# Patient Record
Sex: Male | Born: 1935 | Race: White | Hispanic: No | Marital: Married | Smoking: Never smoker
Health system: Southern US, Community
[De-identification: ages and names within clinical notes are randomized; demographics above are authoritative.]

## PROBLEM LIST (undated history)

## (undated) DIAGNOSIS — R011 Cardiac murmur, unspecified: Secondary | ICD-10-CM

## (undated) DIAGNOSIS — M1990 Unspecified osteoarthritis, unspecified site: Secondary | ICD-10-CM

## (undated) DIAGNOSIS — Z87442 Personal history of urinary calculi: Secondary | ICD-10-CM

## (undated) DIAGNOSIS — C61 Malignant neoplasm of prostate: Secondary | ICD-10-CM

## (undated) DIAGNOSIS — I10 Essential (primary) hypertension: Secondary | ICD-10-CM

## (undated) DIAGNOSIS — R339 Retention of urine, unspecified: Secondary | ICD-10-CM

## (undated) DIAGNOSIS — M109 Gout, unspecified: Secondary | ICD-10-CM

## (undated) HISTORY — DX: Gout, unspecified: M10.9

## (undated) HISTORY — DX: Unspecified osteoarthritis, unspecified site: M19.90

## (undated) HISTORY — DX: Essential (primary) hypertension: I10

## (undated) HISTORY — PX: TOTAL KNEE ARTHROPLASTY: SHX125

## (undated) HISTORY — PX: URINARY SPHINCTER IMPLANT: SHX2624

## (undated) HISTORY — DX: Malignant neoplasm of prostate: C61

---

## 1997-12-03 HISTORY — PX: PROSTATECTOMY: SHX69

## 2012-07-21 DIAGNOSIS — I10 Essential (primary) hypertension: Secondary | ICD-10-CM | POA: Insufficient documentation

## 2012-07-21 DIAGNOSIS — E7800 Pure hypercholesterolemia, unspecified: Secondary | ICD-10-CM | POA: Insufficient documentation

## 2012-07-21 DIAGNOSIS — I2510 Atherosclerotic heart disease of native coronary artery without angina pectoris: Secondary | ICD-10-CM | POA: Insufficient documentation

## 2012-09-07 DIAGNOSIS — Z8579 Personal history of other malignant neoplasms of lymphoid, hematopoietic and related tissues: Secondary | ICD-10-CM | POA: Insufficient documentation

## 2014-06-07 DIAGNOSIS — Z8679 Personal history of other diseases of the circulatory system: Secondary | ICD-10-CM | POA: Insufficient documentation

## 2014-06-07 DIAGNOSIS — F0390 Unspecified dementia without behavioral disturbance: Secondary | ICD-10-CM | POA: Insufficient documentation

## 2014-08-03 DIAGNOSIS — M25562 Pain in left knee: Secondary | ICD-10-CM

## 2014-08-03 DIAGNOSIS — M25561 Pain in right knee: Secondary | ICD-10-CM | POA: Insufficient documentation

## 2014-09-24 DIAGNOSIS — M5416 Radiculopathy, lumbar region: Secondary | ICD-10-CM | POA: Insufficient documentation

## 2016-05-20 DIAGNOSIS — N184 Chronic kidney disease, stage 4 (severe): Secondary | ICD-10-CM | POA: Insufficient documentation

## 2016-05-20 DIAGNOSIS — E785 Hyperlipidemia, unspecified: Secondary | ICD-10-CM | POA: Insufficient documentation

## 2016-07-03 ENCOUNTER — Encounter: Payer: Self-pay | Admitting: Podiatry

## 2016-07-03 ENCOUNTER — Ambulatory Visit (INDEPENDENT_AMBULATORY_CARE_PROVIDER_SITE_OTHER): Payer: Medicare Other | Admitting: Podiatry

## 2016-07-03 VITALS — BP 134/70 | HR 63 | Resp 16 | Ht 69.0 in | Wt 197.0 lb

## 2016-07-03 DIAGNOSIS — L84 Corns and callosities: Secondary | ICD-10-CM | POA: Diagnosis not present

## 2016-07-03 DIAGNOSIS — B351 Tinea unguium: Secondary | ICD-10-CM

## 2016-07-03 DIAGNOSIS — M79674 Pain in right toe(s): Secondary | ICD-10-CM

## 2016-07-03 DIAGNOSIS — M79675 Pain in left toe(s): Secondary | ICD-10-CM

## 2016-07-03 NOTE — Progress Notes (Signed)
   Subjective:    Patient ID: Alexander Hogan, male    DOB: 1922-02-20, 82 y.o.   MRN: [id number]  HPI Chief Complaint  Patient presents with  . Nail Problem    Right foot; great toe; nail discoloration & thickened nail  . Callouses    Bilateral; plantar forefoot-below 15th toe   82 year old male presents the office today for concerns of thick, painful, elongated toenails that she cannot trim herself particular right big toe. Denies any redness or drainage from the toenails. Also states that she has calluses to both feet just below the fifth toe. No drainage or swelling or redness or erythema. No other complaints at this time.   Review of Systems  HENT: Positive for hearing loss.   Musculoskeletal: Positive for gait problem.  All other systems reviewed and are negative.      Objective:   Physical Exam General: AAO x3, NAD  Dermatological: Nails are hypertrophic, dystrophic, brittle, discolored, elongated 10. No surrounding redness or drainage. Tenderness nails 1-5 bilateral laterally. The keratotic lesions bilateral subdural metatarsal side. No underlying ulceration, drainage or other signs of infection. No other open lesions or pre-ulcerative lesions identified at this time.  Vascular: Dorsalis Pedis artery and Posterior Tibial artery pedal pulses are 2/4 bilateral with immedate capillary fill time.  There is no pain with calf compression, swelling, warmth, erythema.   Neruologic: Grossly intact via light touch bilateral. Vibratory intact via tuning fork bilateral. Protective threshold with Semmes Wienstein monofilament intact to all pedal sites bilateral.   Musculoskeletal: No gross boney pedal deformities bilateral. No pain, crepitus, or limitation noted with foot and ankle range of motion bilateral. Muscular strength 5/5 in all groups tested bilateral.  Gait: Unassisted, Nonantalgic.     Assessment & Plan:  82 year old male with symptomatic onychomycosis, hyperkeratotic  lesions -Treatment options discussed including all alternatives, risks, and complications -Etiology of symptoms were discussed -Nails debrided 10 without complications or bleeding. -Hyperkeratotic lesions debrided 2 without complete complications or bleeding. -Daily foot inspection -Follow-up in 3 months or sooner if any problems arise. In the meantime, encouraged to call the office with any questions, concerns, change in symptoms.   Celesta Gentile, DPM

## 2017-05-14 ENCOUNTER — Other Ambulatory Visit: Payer: Self-pay

## 2017-05-14 DIAGNOSIS — M5432 Sciatica, left side: Secondary | ICD-10-CM

## 2017-05-21 ENCOUNTER — Ambulatory Visit
Admission: RE | Admit: 2017-05-21 | Discharge: 2017-05-21 | Disposition: A | Discharge status: Home / Self Care | Payer: Medicare Other | Source: Ambulatory Visit | Attending: Family Medicine | Admitting: Family Medicine

## 2017-05-21 DIAGNOSIS — M5136 Other intervertebral disc degeneration, lumbar region: Secondary | ICD-10-CM | POA: Insufficient documentation

## 2017-05-21 DIAGNOSIS — M5432 Sciatica, left side: Secondary | ICD-10-CM | POA: Diagnosis present

## 2017-05-21 DIAGNOSIS — M48061 Spinal stenosis, lumbar region without neurogenic claudication: Secondary | ICD-10-CM | POA: Insufficient documentation

## 2017-05-21 DIAGNOSIS — M4856XA Collapsed vertebra, not elsewhere classified, lumbar region, initial encounter for fracture: Secondary | ICD-10-CM | POA: Diagnosis not present

## 2017-10-08 ENCOUNTER — Encounter: Payer: Self-pay | Admitting: Urology

## 2017-10-08 ENCOUNTER — Ambulatory Visit (INDEPENDENT_AMBULATORY_CARE_PROVIDER_SITE_OTHER): Payer: Medicare Other | Admitting: Urology

## 2017-10-08 VITALS — BP 149/80 | HR 81 | Ht 69.0 in | Wt 200.0 lb

## 2017-10-08 DIAGNOSIS — R32 Unspecified urinary incontinence: Secondary | ICD-10-CM

## 2017-10-08 DIAGNOSIS — Z8546 Personal history of malignant neoplasm of prostate: Secondary | ICD-10-CM | POA: Diagnosis not present

## 2017-10-08 DIAGNOSIS — N393 Stress incontinence (female) (male): Secondary | ICD-10-CM

## 2017-10-08 LAB — URINALYSIS, COMPLETE
Bilirubin, UA: NEGATIVE
Glucose, UA: NEGATIVE
KETONES UA: NEGATIVE
LEUKOCYTES UA: NEGATIVE
NITRITE UA: NEGATIVE
Protein, UA: NEGATIVE
RBC UA: NEGATIVE
SPEC GRAV UA: 1.01 (ref 1.005–1.030)
UUROB: 0.2 mg/dL (ref 0.2–1.0)
pH, UA: 5.5 (ref 5.0–7.5)

## 2017-10-08 LAB — BLADDER SCAN AMB NON-IMAGING: Scan Result: 18

## 2017-10-08 NOTE — Progress Notes (Signed)
10/08/2017 4:46 PM   Alexander Hogan [birth year] [id number]  Referring provider: Dion Body, MD [place] [facility] [place], [place] [address]  Chief Complaint  Patient presents with  . Urinary Incontinence    HPI: 83 year old male with a history of prostate cancer status post radical prostatectomy in 1999.  He had post prostatectomy incontinence and had placement of an artificial urinary sphincter.  He had a sphincter replaced around 2012 and subsequently required a revision.  He presently states he is doing well.  He has mild dysuria at times.  His wife says he strains to urinate however he denies.  He has no specific complaints today and wanted to establish care in the area.  His PSA remained undetectable and has not been checked since 2012.   PMH: Past Medical History:  Diagnosis Date  . Arthritis   . Asthma   . Gout   . High blood pressure   . Prostate cancer [facility])     Surgical History: Past Surgical History:  Procedure Laterality Date  . PROSTATECTOMY  1999  . TOTAL KNEE ARTHROPLASTY  2005-2008   x2    Home Medications:  Allergies as of 10/08/2017      Reactions   Hydrocodone Other (See Comments)   confusion   Penicillins Rash      Medication List        Accurate as of 10/08/17  4:46 PM. Always use your most recent med list.          atorvastatin 80 MG tablet Commonly known as:  LIPITOR   donepezil 10 MG tablet Commonly known as:  ARICEPT   furosemide 80 MG tablet Commonly known as:  LASIX Take by mouth.   hyoscyamine 0.125 MG SL tablet Commonly known as:  LEVSIN SL dissolve 1 to 2 tablets UNDER THE TONGUE every 4 hours if needed . DO NOT TAKE MORE THAN 12 TABLETS IN 24 HOURS   memantine 10 MG tablet Commonly known as:  NAMENDA   memantine 10 MG tablet Commonly known as:  NAMENDA TAKE 1 TABLET TWICE A DAY   metoprolol succinate 25 MG 24 hr tablet Commonly known as:  TOPROL-XL   URIBEL 118 MG Caps Take by  mouth.       Allergies:  Allergies  Allergen Reactions  . Hydrocodone Other (See Comments)    confusion  . Penicillins Rash    Family History: History reviewed. No pertinent family history.  Social History:  reports that  has never smoked. He does not have any smokeless tobacco history on file. His alcohol and drug histories are not on file.  ROS: UROLOGY Frequent Urination?: Yes Hard to postpone urination?: No Burning/pain with urination?: Yes Get up at night to urinate?: Yes Leakage of urine?: Yes Urine stream starts and stops?: No Trouble starting stream?: Yes Do you have to strain to urinate?: Yes Blood in urine?: No Urinary tract infection?: Yes Sexually transmitted disease?: No Injury to kidneys or bladder?: No Painful intercourse?: No Weak stream?: No Erection problems?: No Penile pain?: No  Gastrointestinal Nausea?: No Vomiting?: No Indigestion/heartburn?: No Diarrhea?: Yes Constipation?: Yes  Constitutional Fever: No Night sweats?: No Weight loss?: No Fatigue?: No  Skin Skin rash/lesions?: Yes Itching?: No  Eyes Blurred vision?: No Double vision?: No  Ears/Nose/Throat Sore throat?: No Sinus problems?: Yes  Hematologic/Lymphatic Swollen glands?: No Easy bruising?: Yes  Cardiovascular Leg swelling?: Yes Chest pain?: No  Respiratory Cough?: No Shortness of breath?: No  Endocrine Excessive thirst?:  No  Musculoskeletal Back pain?: Yes Joint pain?: Yes  Neurological Headaches?: No Dizziness?: No  Psychologic Depression?: No Anxiety?: No  Physical Exam: BP (!) 149/80 (BP Location: Right Arm, Patient Position: Sitting, Cuff Size: Large)   Pulse 81   Ht 5\' 9"  (1.753 m)   Wt 200 lb (90.7 kg)   BMI 29.53 kg/m   Constitutional:  Alert and oriented, No acute distress. HEENT: [place] AT, moist mucus membranes.  Trachea midline, no masses. Cardiovascular: No clubbing, cyanosis, or edema. Respiratory: Normal respiratory effort, no  increased work of breathing. GI: Abdomen is soft, nontender, nondistended, no abdominal masses GU: No CVA tenderness.  Penis uncircumcised.  Testes descended and atrophic.  Pump easily palpable right hemiscrotum Skin: No rashes, bruises or suspicious lesions. Lymph: No cervical or inguinal adenopathy. Neurologic: Grossly intact, no focal deficits, moving all 4 extremities. Psychiatric: Normal mood and affect.  Laboratory Data:  Urinalysis Lab Results  Component Value Date   SPECGRAV 1.010 10/08/2017   PHUR 5.5 10/08/2017   COLORU Green (A) 10/08/2017   APPEARANCEUR Cloudy (A) 10/08/2017   LEUKOCYTESUR Negative 10/08/2017   PROTEINUR Negative 10/08/2017   GLUCOSEU Negative 10/08/2017   KETONESU Negative 10/08/2017   RBCU Negative 10/08/2017   BILIRUBINUR Negative 10/08/2017   UUROB 0.2 10/08/2017   NITRITE Negative 10/08/2017     Assessment & Plan:    1.  Post prostatectomy incontinence Doing well with artificial urinary sphincter.  He was initially unable to obtain a urine and his estimated bladder volume by bladder scan was only 18 mL.  He was subsequently able to void and urinalysis showed no abnormalities on microscopy.  He will follow-up annually or as needed.  - Urinalysis, Complete - Bladder Scan (Post Void Residual) in office   Return in about 1 year (around 10/08/2018) for Recheck.  Abbie Sons, [place] [address], [place], [place] [address] [phone]

## 2017-12-10 ENCOUNTER — Telehealth: Payer: Self-pay | Admitting: Urology

## 2017-12-10 NOTE — Telephone Encounter (Signed)
Pt states he needs some Cipro.  He saw [facility] 1 time and this is not for a refill, but he says sometimes doctors give him Cipro.  Please call pt [phone]

## 2017-12-10 NOTE — Telephone Encounter (Signed)
I would not recommend taking Cipro unless there is evidence of a UTI

## 2017-12-10 NOTE — Telephone Encounter (Signed)
Pt states that previous urologist would give him cipro as needed to go along with his uribel and therefore he was wanting more. Please advise.

## 2017-12-17 NOTE — Telephone Encounter (Signed)
Spoke with pt in reference to not taking cipro unless UTI is present. Pt became frustrated and stated he has an appt in Feb to discuss further with Dr. Bernardo Heater.

## 2018-06-09 ENCOUNTER — Other Ambulatory Visit: Payer: Self-pay | Admitting: Family Medicine

## 2018-06-09 ENCOUNTER — Ambulatory Visit
Admission: RE | Admit: 2018-06-09 | Discharge: 2018-06-09 | Disposition: A | Discharge status: Home / Self Care | Payer: Medicare Other | Source: Ambulatory Visit | Attending: Family Medicine | Admitting: Family Medicine

## 2018-06-09 DIAGNOSIS — R600 Localized edema: Secondary | ICD-10-CM | POA: Diagnosis not present

## 2018-06-09 DIAGNOSIS — M7989 Other specified soft tissue disorders: Secondary | ICD-10-CM

## 2018-08-26 ENCOUNTER — Other Ambulatory Visit: Payer: Self-pay

## 2018-08-26 ENCOUNTER — Inpatient Hospital Stay: Payer: Medicare Other | Attending: Oncology | Admitting: Oncology

## 2018-08-26 ENCOUNTER — Encounter: Payer: Self-pay | Admitting: Oncology

## 2018-08-26 VITALS — BP 107/68 | HR 70 | Temp 98.1°F | Resp 18 | Ht 69.0 in | Wt 198.1 lb

## 2018-08-26 DIAGNOSIS — Z8249 Family history of ischemic heart disease and other diseases of the circulatory system: Secondary | ICD-10-CM | POA: Insufficient documentation

## 2018-08-26 DIAGNOSIS — C438 Malignant melanoma of overlapping sites of skin: Secondary | ICD-10-CM | POA: Diagnosis present

## 2018-08-26 DIAGNOSIS — Z809 Family history of malignant neoplasm, unspecified: Secondary | ICD-10-CM

## 2018-08-26 DIAGNOSIS — Z8052 Family history of malignant neoplasm of bladder: Secondary | ICD-10-CM | POA: Diagnosis not present

## 2018-08-26 DIAGNOSIS — Z803 Family history of malignant neoplasm of breast: Secondary | ICD-10-CM | POA: Insufficient documentation

## 2018-08-26 DIAGNOSIS — Z79899 Other long term (current) drug therapy: Secondary | ICD-10-CM | POA: Diagnosis not present

## 2018-08-26 DIAGNOSIS — Z7189 Other specified counseling: Secondary | ICD-10-CM | POA: Insufficient documentation

## 2018-08-26 DIAGNOSIS — Z8546 Personal history of malignant neoplasm of prostate: Secondary | ICD-10-CM | POA: Diagnosis not present

## 2018-08-26 DIAGNOSIS — Z85820 Personal history of malignant melanoma of skin: Secondary | ICD-10-CM | POA: Diagnosis not present

## 2018-08-26 DIAGNOSIS — C439 Malignant melanoma of skin, unspecified: Secondary | ICD-10-CM | POA: Insufficient documentation

## 2018-08-26 NOTE — Progress Notes (Signed)
Hematology/Oncology Consult note [facility] Telephone:([phone] [phone]   Patient Care Team: Dion Body, MD as PCP - General (Family Medicine)  REFERRING PROVIDER: [facility] CHIEF COMPLAINTS/REASON FOR VISIT:  Evaluation of melanoma  HISTORY OF PRESENTING ILLNESS:  FREDI GEILER is a  84 y.o.  male with PMH listed below who was referred to me for evaluation of melanoma.  Patient recently saw dermatologist [facility] for evaluation of right lower extremity swelling and dark spots on the leg.  Patient reports history of melanoma of right lower extremity and s/p resection in May 2017. From May 2017 to April 2019, he has done well and per dermatology note, there was no evidence of recurrence and no lymphadenopathy.  03/19/2018, he developed an ulcer of this leg and was biopsied and pathology was benign.  04/17/2018 He had a follow up for persistent right medial pretibial ulcer and stasis changes, culture was positive for Staph Aureus. He was treated with Mupirocin and cephalexin. At the same time, 3 small blue macules were noted. Patient was scheduled to follow up in 6 weeks for biopsy as dermatology wants to wait until ulcer healed. Patient cancelled his appointment in July 2019 and no showed to another rescheduled appointment. He was recently seen in September 2019.   Right proximal lateral tibial lesion punch biopsy showed malignant melanoma, favor metastatic focus.   The breslow's thickness is 1.66m.   History of basal cell carcinoma s/p excision and Moh's surgery.  History of melanoma in situ of left cheek, s/p topical treatment.  04/03/2016 Right mid lateral pretibial melanoma, tumor thickness 0.73 anatomic level IV, s/p WLE, negative margin.   Review of Systems  Constitutional: Positive for malaise/fatigue. Negative for chills, fever and weight loss.  HENT: Negative for nosebleeds and sore throat.   Eyes: Negative for double vision,  photophobia and redness.  Respiratory: Negative for cough, shortness of breath and wheezing.   Cardiovascular: Positive for leg swelling. Negative for chest pain, palpitations and orthopnea.  Gastrointestinal: Negative for abdominal pain, blood in stool, nausea and vomiting.  Genitourinary: Negative for dysuria.  Musculoskeletal: Negative for back pain, myalgias and neck pain.  Skin: Positive for rash. Negative for itching.  Neurological: Negative for dizziness, tingling and tremors.  Endo/Heme/Allergies: Negative for environmental allergies. Does not bruise/bleed easily.  Psychiatric/Behavioral: Negative for depression.    MEDICAL HISTORY:  Past Medical History:  Diagnosis Date  . Arthritis   . Gout   . High blood pressure   . Prostate cancer ([facility]     SURGICAL HISTORY: Past Surgical History:  Procedure Laterality Date  . PROSTATECTOMY  1999  . TOTAL KNEE ARTHROPLASTY  2005-2008   x2    SOCIAL HISTORY: Social History   Socioeconomic History  . Marital status: Married    Spouse name: Not on file  . Number of children: Not on file  . Years of education: Not on file  . Highest education level: Not on file  Occupational History  . Not on file  Social Needs  . Financial resource strain: Not on file  . Food insecurity:    Worry: Not on file    Inability: Not on file  . Transportation needs:    Medical: Not on file    Non-medical: Not on file  Tobacco Use  . Smoking status: Never Smoker  . Smokeless tobacco: Never Used  Substance and Sexual Activity  . Alcohol use: Never    Frequency: Never  . Drug use: Never  . Sexual  activity: Not Currently  Lifestyle  . Physical activity:    Days per week: Not on file    Minutes per session: Not on file  . Stress: Not on file  Relationships  . Social connections:    Talks on phone: Not on file    Gets together: Not on file    Attends religious service: Not on file    Active member of club or organization: Not on file     Attends meetings of clubs or organizations: Not on file    Relationship status: Not on file  . Intimate partner violence:    Fear of current or ex partner: Not on file    Emotionally abused: Not on file    Physically abused: Not on file    Forced sexual activity: Not on file  Other Topics Concern  . Not on file  Social History Narrative  . Not on file    FAMILY HISTORY: Family History  Problem Relation Age of Onset  . Bladder Cancer Mother 42  . Kidney cancer Father 34  . Heart attack Father   . Heart Problems Sister     ALLERGIES:  is allergic to hydrocodone and penicillins.  MEDICATIONS:  Current Outpatient Medications  Medication Sig Dispense Refill  . atorvastatin (LIPITOR) 80 MG tablet Take 80 mg by mouth daily at 6 PM.     . donepezil (ARICEPT) 10 MG tablet Take 10 mg by mouth at bedtime.     . hyoscyamine (LEVSIN SL) 0.125 MG SL tablet dissolve 1 to 2 tablets UNDER THE TONGUE every 4 hours if needed . DO NOT TAKE MORE THAN 12 TABLETS IN 24 HOURS    . losartan (COZAAR) 50 MG tablet Take 50 mg by mouth daily.  11  . memantine (NAMENDA) 10 MG tablet Take 10 mg by mouth 2 (two) times daily.     . Meth-Hyo-M Bl-Na Phos-Ph Sal (URIBEL) 118 MG CAPS Take 118 mg by mouth daily as needed.     . metoprolol succinate (TOPROL-XL) 25 MG 24 hr tablet Take 25 mg by mouth daily.     . potassium chloride (K-DUR) 10 MEQ tablet Take 10 mEq by mouth daily.  3  . torsemide (DEMADEX) 20 MG tablet Take 40 mg by mouth daily.    . furosemide (LASIX) 80 MG tablet Take 40 mg by mouth 2 (two) times daily as needed.      No current facility-administered medications for this visit.      PHYSICAL EXAMINATION: ECOG PERFORMANCE STATUS: 2 - Symptomatic, <50% confined to bed Vitals:   08/26/18 1545  BP: 107/68  Pulse: 70  Resp: 18  Temp: 98.1 F (36.7 C)   Filed Weights   08/26/18 1545  Weight: 198 lb 1.6 oz (89.9 kg)    Physical Exam  Constitutional: He is oriented to person, place, and  time. No distress.  HENT:  Head: Normocephalic and atraumatic.  Mouth/Throat: Oropharynx is clear and moist.  Eyes: Pupils are equal, round, and reactive to light. EOM are normal. No scleral icterus.  Neck: Normal range of motion. Neck supple.  Cardiovascular: Normal rate and regular rhythm.  Murmur heard. Pulmonary/Chest: Effort normal. No respiratory distress. He has no wheezes.  Abdominal: Soft. Bowel sounds are normal. He exhibits no distension and no mass. There is no tenderness.  Musculoskeletal: Normal range of motion. He exhibits no edema or deformity.  Neurological: He is alert and oriented to person, place, and time. No cranial nerve deficit. Coordination normal.  Skin: Skin is warm and dry.  Right lower extremity innumerous dark spot. Dominant lesion s/p punch biopsy  Psychiatric: He has a normal mood and affect. His behavior is normal. Thought content normal.       LABORATORY DATA:  I have reviewed the data as listed No results found for: WBC, HGB, HCT, MCV, PLT No results for input(s): NA, K, CL, CO2, GLUCOSE, BUN, CREATININE, CALCIUM, GFRNONAA, GFRAA, PROT, ALBUMIN, AST, ALT, ALKPHOS, BILITOT, BILIDIR, IBILI in the last 8760 hours. Iron/TIBC/Ferritin/ %Sat No results found for: IRON, TIBC, FERRITIN, IRONPCTSAT      ASSESSMENT & PLAN:  1. Melanoma of skin ([place])   2. Goals of care, counseling/discussion   3. Malignant melanoma of overlapping sites ([place])   4. History of prostate cancer   5. Family history of cancer    Pathology was discussed with patient, his wife and daughter.  Findings are consistent with metastatic melanoma. Suspect that he has multiple cutaneous metastatic  melanoma all over his right lower extremity.  Plan obtain PET scan for further evaluation of disease burden.  Discussed about sending NGS for mutation analysis. 50% melanoma carries BRAF mutation.  Also immunotherapy can be considered as well.  The diagnosis and care plan were discussed  with patient in detail.  NCCN guidelines were reviewed and shared with patient.  # Goal of care: The goal of treatment which is to palliate disease, disease related symptoms, improve quality of life and hopefully prolong life was highlighted in our discussion.   Supportive care measures are necessary for patient well-being and will be provided as necessary. We spent sufficient time to discuss many aspect of care, questions were answered to patient's satisfaction.  History of prostate cancer.  Family history of breast cancer [daughter], bladder cancer [mother]. Discussed about genetic counseling. Will refer at next visit.   Check cbc, cmp LDH at next visit.  Orders Placed This Encounter  Procedures  . NM PET Image Initial (PI) Whole Body    Standing Status:   Future    Standing Expiration Date:   10/27/2019    Order Specific Question:   If indicated for the ordered procedure, I authorize the administration of a radiopharmaceutical per Radiology protocol    Answer:   Yes    Order Specific Question:   Preferred imaging location?    Answer:   [facility]    Order Specific Question:   Radiology Contrast Protocol - do NOT remove file path    Answer:   [email or web address]  . Lactate dehydrogenase    Standing Status:   Future    Standing Expiration Date:   08/27/2019  . CBC with Differential/Platelet    Standing Status:   Future    Standing Expiration Date:   08/26/2019  . Comprehensive metabolic panel    Standing Status:   Future    Standing Expiration Date:   08/26/2019    All questions were answered. The patient knows to call the clinic with any problems questions or concerns.  Return of visit: after PET scan.  Thank you for this kind referral and the opportunity to participate in the care of this patient. A copy of today's note is routed to referring provider  Total face to face encounter time for this patient visit was 60 min. >50% of the time was  spent in  counseling and coordination of care.    Earlie Server, MD, PhD Hematology Oncology [facility] at [facility] Pager- [phone] 08/26/2018

## 2018-08-26 NOTE — Progress Notes (Signed)
Patient here for initial visit. No smoking history.

## 2018-08-29 ENCOUNTER — Ambulatory Visit
Admission: RE | Admit: 2018-08-29 | Discharge: 2018-08-29 | Disposition: A | Discharge status: Home / Self Care | Payer: Medicare Other | Source: Ambulatory Visit | Attending: Oncology | Admitting: Oncology

## 2018-08-29 DIAGNOSIS — R600 Localized edema: Secondary | ICD-10-CM | POA: Insufficient documentation

## 2018-08-29 DIAGNOSIS — C439 Malignant melanoma of skin, unspecified: Secondary | ICD-10-CM | POA: Diagnosis present

## 2018-08-29 DIAGNOSIS — L03115 Cellulitis of right lower limb: Secondary | ICD-10-CM | POA: Insufficient documentation

## 2018-08-29 LAB — GLUCOSE, CAPILLARY: GLUCOSE-CAPILLARY: 90 mg/dL (ref 70–99)

## 2018-08-29 MED ORDER — FLUDEOXYGLUCOSE F - 18 (FDG) INJECTION
11.0500 | Freq: Once | INTRAVENOUS | Status: AC | PRN
  Administered 2018-08-29: 11.05 via INTRAVENOUS

## 2018-09-02 ENCOUNTER — Other Ambulatory Visit: Payer: Medicare Other

## 2018-09-03 ENCOUNTER — Inpatient Hospital Stay: Payer: Medicare Other | Admitting: Oncology

## 2018-09-03 ENCOUNTER — Inpatient Hospital Stay: Payer: Medicare Other

## 2018-09-25 ENCOUNTER — Encounter: Payer: Self-pay | Admitting: Oncology

## 2018-09-26 ENCOUNTER — Encounter: Payer: Self-pay | Admitting: Oncology

## 2018-10-07 ENCOUNTER — Encounter: Payer: Self-pay | Admitting: Urology

## 2018-10-07 ENCOUNTER — Ambulatory Visit (INDEPENDENT_AMBULATORY_CARE_PROVIDER_SITE_OTHER): Payer: Medicare Other | Admitting: Urology

## 2018-10-07 VITALS — BP 101/64 | HR 82 | Ht 69.0 in | Wt 200.0 lb

## 2018-10-07 DIAGNOSIS — Z8546 Personal history of malignant neoplasm of prostate: Secondary | ICD-10-CM

## 2018-10-07 NOTE — Progress Notes (Signed)
10/07/2018 4:07 PM   Alexander Hogan [birth year] [id number]  Referring provider: Dion Body, MD [facility] [place], [place] [address]  No chief complaint on file.   HPI: 84 year old male presents for annual follow-up.  He has a history of prostate cancer status post radical prostatectomy and 1999.  This was complicated by post prostatectomy incontinence requiring placement of an artificial urinary sphincter which was replaced in 2012 and subsequently required a revision.  He presently has stable lower urinary tract symptoms.  He has occasional dysuria and takes Uribel as needed.  Denies gross hematuria.  Denies flank, abdominal, pelvic or scrotal pain.  He was recently diagnosed with regionally advanced melanoma of the right lower extremity and has an appointment with oncology at [facility] tomorrow.   PMH: Past Medical History:  Diagnosis Date  . Arthritis   . Gout   . High blood pressure   . Prostate cancer [facility])     Surgical History: Past Surgical History:  Procedure Laterality Date  . PROSTATECTOMY  1999  . TOTAL KNEE ARTHROPLASTY  2005-2008   x2    Home Medications:  Allergies as of 10/07/2018      Reactions   Hydrocodone Other (See Comments)   confusion   Penicillins Rash      Medication List        Accurate as of 10/07/18  4:07 PM. Always use your most recent med list.          atorvastatin 80 MG tablet Commonly known as:  LIPITOR Take 80 mg by mouth daily at 6 PM.   donepezil 10 MG tablet Commonly known as:  ARICEPT Take 10 mg by mouth at bedtime.   furosemide 80 MG tablet Commonly known as:  LASIX Take 40 mg by mouth 2 (two) times daily as needed.   hyoscyamine 0.125 MG SL tablet Commonly known as:  LEVSIN SL dissolve 1 to 2 tablets UNDER THE TONGUE every 4 hours if needed . DO NOT TAKE MORE THAN 12 TABLETS IN 24 HOURS   losartan 50 MG tablet Commonly known as:  COZAAR Take 50 mg by mouth daily.     memantine 10 MG tablet Commonly known as:  NAMENDA Take 10 mg by mouth 2 (two) times daily.   metoprolol succinate 25 MG 24 hr tablet Commonly known as:  TOPROL-XL Take 25 mg by mouth daily.   potassium chloride 10 MEQ tablet Commonly known as:  K-DUR Take 10 mEq by mouth daily.   torsemide 20 MG tablet Commonly known as:  DEMADEX Take 40 mg by mouth daily.   URIBEL 118 MG Caps Take 118 mg by mouth daily as needed.       Allergies:  Allergies  Allergen Reactions  . Hydrocodone Other (See Comments)    confusion  . Penicillins Rash    Family History: Family History  Problem Relation Age of Onset  . Bladder Cancer Mother 29  . Kidney cancer Father 38  . Heart attack Father   . Heart Problems Sister     Social History:  reports that he has never smoked. He has never used smokeless tobacco. He reports that he does not drink alcohol or use drugs.  ROS: UROLOGY Frequent Urination?: Yes Hard to postpone urination?: No Burning/pain with urination?: Yes Get up at night to urinate?: Yes Leakage of urine?: Yes Urine stream starts and stops?: No Trouble starting stream?: No Do you have to strain to urinate?: Yes Blood in urine?: No  Urinary tract infection?: Yes Sexually transmitted disease?: No Injury to kidneys or bladder?: No Painful intercourse?: No Weak stream?: No Erection problems?: No Penile pain?: No  Gastrointestinal Nausea?: No Vomiting?: No Indigestion/heartburn?: No Diarrhea?: No Constipation?: Yes  Constitutional Fever: No Night sweats?: No Weight loss?: No Fatigue?: No  Skin Skin rash/lesions?: No Itching?: No  Eyes Blurred vision?: No Double vision?: No  Ears/Nose/Throat Sore throat?: No Sinus problems?: Yes  Hematologic/Lymphatic Swollen glands?: No Easy bruising?: No  Cardiovascular Leg swelling?: Yes Chest pain?: No  Respiratory Cough?: Yes Shortness of breath?: No  Endocrine Excessive thirst?:  No  Musculoskeletal Back pain?: No Joint pain?: Yes  Neurological Headaches?: No Dizziness?: No  Psychologic Depression?: No Anxiety?: Yes  Physical Exam: BP 101/64   Pulse 82   Ht 5\' 9"  (1.753 m)   Wt 200 lb (90.7 kg)   BMI 29.53 kg/m   Constitutional:  Alert and oriented, No acute distress. HEENT: [place] AT, moist mucus membranes.  Trachea midline, no masses. Cardiovascular: No clubbing, cyanosis, or edema. Respiratory: Normal respiratory effort, no increased work of breathing. GI: Abdomen is soft, nontender, nondistended, no abdominal masses GU: No CVA tenderness Lymph: No cervical or inguinal lymphadenopathy. Skin: No rashes, bruises or suspicious lesions. Neurologic: Grossly intact, no focal deficits, moving all 4 extremities. Psychiatric: Normal mood and affect.   Assessment & Plan:   84 year old male with an artificial urinary sphincter placed after post prostatectomy incontinence.  He has stable lower urinary tract symptoms.  He would like to continue annual follow-up.   Abbie Sons, [place] [address] [place], [place] [phone]

## 2018-10-08 ENCOUNTER — Encounter: Payer: Self-pay | Admitting: Urology

## 2018-11-09 ENCOUNTER — Encounter: Payer: Self-pay | Admitting: Emergency Medicine

## 2018-11-09 ENCOUNTER — Emergency Department
Admission: EM | Admit: 2018-11-09 | Discharge: 2018-11-09 | Disposition: A | Discharge status: Home / Self Care | Payer: Medicare Other | Source: Home / Self Care

## 2018-11-09 ENCOUNTER — Emergency Department
Admission: EM | Admit: 2018-11-09 | Discharge: 2018-11-09 | Disposition: A | Discharge status: Home / Self Care | Payer: Medicare Other | Attending: Emergency Medicine | Admitting: Emergency Medicine

## 2018-11-09 ENCOUNTER — Other Ambulatory Visit: Payer: Self-pay

## 2018-11-09 DIAGNOSIS — Z79899 Other long term (current) drug therapy: Secondary | ICD-10-CM | POA: Diagnosis not present

## 2018-11-09 DIAGNOSIS — R339 Retention of urine, unspecified: Secondary | ICD-10-CM | POA: Diagnosis not present

## 2018-11-09 DIAGNOSIS — Z96659 Presence of unspecified artificial knee joint: Secondary | ICD-10-CM | POA: Insufficient documentation

## 2018-11-09 DIAGNOSIS — Z5321 Procedure and treatment not carried out due to patient leaving prior to being seen by health care provider: Secondary | ICD-10-CM | POA: Insufficient documentation

## 2018-11-09 DIAGNOSIS — R1030 Lower abdominal pain, unspecified: Secondary | ICD-10-CM | POA: Insufficient documentation

## 2018-11-09 DIAGNOSIS — Z8546 Personal history of malignant neoplasm of prostate: Secondary | ICD-10-CM | POA: Diagnosis not present

## 2018-11-09 LAB — URINALYSIS, COMPLETE (UACMP) WITH MICROSCOPIC
Bacteria, UA: NONE SEEN
Specific Gravity, Urine: 1.017 (ref 1.005–1.030)

## 2018-11-09 MED ORDER — LIDOCAINE HCL URETHRAL/MUCOSAL 2 % EX GEL
1.0000 "application " | Freq: Once | CUTANEOUS | Status: DC | PRN
  Filled 2018-11-09: qty 30

## 2018-11-09 NOTE — ED Notes (Signed)
Pt states his pain has greatly improved. Informed pt and wife of waiting of bed assignment to have the EDP exam the pt.

## 2018-11-09 NOTE — ED Provider Notes (Signed)
[facility] Emergency Department Provider Note ____________________________________________   I have reviewed the triage vital signs and the triage nursing note.  HISTORY  Chief Complaint Urinary Retention   Historian Patient  HPI Alexander Hogan is a 84 y.o. male with a history of prostate cancer with a urinary sphincter pump, follows with Dr. Bernardo Heater, presents with trouble with the pump sounds like maybe for a couple weeks.  Family believes that the pump is worn out, but had been discussed not to replace it given his age.  This is a second pump.  In any case he feels like he needs to pee but he cannot get the urinary sphincter pump to work.  Mild discomfort of the suprapubic area.  Denies fever.  Denies nausea or vomiting.  Denies diarrhea.  He was recently diagnosed with metastatic melanoma and is starting treatment for that.  Its effect to his right lower extremity with swelling.    Past Medical History:  Diagnosis Date  . Arthritis   . Gout   . High blood pressure   . Prostate cancer [facility])     Patient Active Problem List   Diagnosis Date Noted  . Goals of care, counseling/discussion 08/26/2018  . Melanoma of skin ([place]) 08/26/2018  . Personal history of prostate cancer 10/08/2017  . Stress incontinence of urine 10/08/2017    Past Surgical History:  Procedure Laterality Date  . PROSTATECTOMY  1999  . TOTAL KNEE ARTHROPLASTY  2005-2008   x2  . URINARY SPHINCTER IMPLANT      Prior to Admission medications   Medication Sig Start Date End Date Taking? Authorizing Provider  atorvastatin (LIPITOR) 80 MG tablet Take 80 mg by mouth daily at 6 PM.  04/20/16   [provider]  donepezil (ARICEPT) 10 MG tablet Take 10 mg by mouth at bedtime.  05/03/16   [provider]  furosemide (LASIX) 80 MG tablet Take 40 mg by mouth 2 (two) times daily as needed.  02/17/16   [provider]  hyoscyamine (LEVSIN SL) 0.125 MG SL tablet  dissolve 1 to 2 tablets UNDER THE TONGUE every 4 hours if needed . DO NOT TAKE MORE THAN 12 TABLETS IN 24 HOURS 04/10/16   [provider]  losartan (COZAAR) 50 MG tablet Take 50 mg by mouth daily. 08/06/18   [provider]  memantine (NAMENDA) 10 MG tablet Take 10 mg by mouth 2 (two) times daily.  04/20/16   [provider]  Meth-Hyo-M Bl-Na Phos-Ph Sal (URIBEL) 118 MG CAPS Take 118 mg by mouth daily as needed.     [provider]  metoprolol succinate (TOPROL-XL) 25 MG 24 hr tablet Take 25 mg by mouth daily.  05/01/16   [provider]  potassium chloride (K-DUR) 10 MEQ tablet Take 10 mEq by mouth daily. 08/06/18   [provider]  torsemide (DEMADEX) 20 MG tablet Take 40 mg by mouth daily. 08/06/18   [provider]    Allergies  Allergen Reactions  . Hydrocodone Other (See Comments)    confusion  . Penicillins Rash    Family History  Problem Relation Age of Onset  . Bladder Cancer Mother 65  . Kidney cancer Father 72  . Heart attack Father   . Heart Problems Sister     Social History Social History   Tobacco Use  . Smoking status: Never Smoker  . Smokeless tobacco: Never Used  Substance Use Topics  . Alcohol use: Never  Frequency: Never  . Drug use: Never    Review of Systems  Constitutional: Negative for fever. Eyes: Negative for visual changes. ENT: Negative for sore throat. Cardiovascular: Negative for chest pain. Respiratory: Negative for shortness of breath. Gastrointestinal: Negative for abdominal pain, vomiting and diarrhea. Genitourinary: Negative for hematuria. Musculoskeletal: Negative for back pain. Skin: Negative for rash. Neurological: Negative for headache.  ____________________________________________   PHYSICAL EXAM:  VITAL SIGNS: ED Triage Vitals  Enc Vitals Group     BP 11/09/18 1020 (!) 148/101     Pulse Rate 11/09/18 1020 65     Resp 11/09/18 1020 16     Temp 11/09/18 1020 97.8  F (36.6 C)     Temp Source 11/09/18 1020 Oral     SpO2 11/09/18 1020 98 %     Weight 11/09/18 1042 195 lb (88.5 kg)     Height 11/09/18 1042 5\' 10"  (1.778 m)     Head Circumference --      Peak Flow --      Pain Score 11/09/18 0942 0     Pain Loc --      Pain Edu? --      Excl. in [place]? --      Constitutional: Alert and oriented.  HEENT      Head: Normocephalic and atraumatic.      Eyes: Conjunctivae are normal. Pupils equal and round.       Ears:         Nose: No congestion/rhinnorhea.      Mouth/Throat: Mucous membranes are moist.      Neck: No stridor. Cardiovascular/Chest: Normal rate, regular rhythm.  No murmurs, rubs, or gallops. Respiratory: Normal respiratory effort without tachypnea nor retractions. Breath sounds are clear and equal bilaterally. No wheezes/rales/rhonchi. Gastrointestinal: Soft. No distention, no guarding, no rebound.  Minor discomfort in the suprapubic area. Genitourinary/rectal:Deferred Musculoskeletal: Nontender with normal range of motion in all extremities. No joint effusions.  No lower extremity tenderness.  No edema. Neurologic:  Normal speech and language. No gross or focal neurologic deficits are appreciated. Skin:  Skin is warm, dry and intact. No rash noted. Psychiatric: Mood and affect are normal. Speech and behavior are normal. Patient exhibits appropriate insight and judgment.   ____________________________________________  LABS (pertinent positives/negatives) I, Lisa Roca, MD the attending physician have reviewed the labs noted below.  Labs Reviewed  URINALYSIS, COMPLETE (UACMP) WITH MICROSCOPIC - Abnormal; Notable for the following components:      Result Value   Color, Urine BLUE (*)    APPearance HAZY (*)    Glucose, UA   (*)    Value: TEST NOT REPORTED DUE TO COLOR INTERFERENCE OF URINE PIGMENT   Hgb urine dipstick   (*)    Value: TEST NOT REPORTED DUE TO COLOR INTERFERENCE OF URINE PIGMENT   Bilirubin Urine   (*)    Value:  TEST NOT REPORTED DUE TO COLOR INTERFERENCE OF URINE PIGMENT   Ketones, ur   (*)    Value: TEST NOT REPORTED DUE TO COLOR INTERFERENCE OF URINE PIGMENT   Protein, ur   (*)    Value: TEST NOT REPORTED DUE TO COLOR INTERFERENCE OF URINE PIGMENT   Nitrite   (*)    Value: TEST NOT REPORTED DUE TO COLOR INTERFERENCE OF URINE PIGMENT   Leukocytes, UA   (*)    Value: TEST NOT REPORTED DUE TO COLOR INTERFERENCE OF URINE PIGMENT   All other components within normal limits    ____________________________________________  EKG I, Lisa Roca, MD, the attending physician have personally viewed and interpreted all ECGs.  None ____________________________________________  RADIOLOGY   None __________________________________________  PROCEDURES  Procedure(s) performed: None  Procedures  Critical Care performed: None   ____________________________________________  ED COURSE / ASSESSMENT AND PLAN  Pertinent labs & imaging results that were available during my care of the patient were reviewed by me and considered in my medical decision making (see chart for details).     Bladder scan shows 300 cc, the patient states he is unable to get his pump to work.  He is requesting a Foley catheter.  We did go ahead and place a Foley catheter.  No evidence of urinary tract infection on UA, will send for culture.  Patient is not having any ongoing abdominal pain.  Do not think laboratory studies are necessary today.  I have a suspicion for any other source of medical or surgical intra-abdominal emergency.  We will have him follow with his urologist.    CONSULTATIONS:  None  Patient / Family / Caregiver informed of clinical course, medical decision-making process, and agree with plan.   I discussed return precautions, follow-up instructions, and discharge instructions with patient and/or family.  Discharge Instructions : You are evaluated for problems urinating called urinary  retention, as we discussed, may be due to a pump malfunction or failure.  Leave Foley catheter in place until you discuss with your urologist.  Urine sample did not appear to have sign of urinary tract infection.  Return to the emergency department immediately for any worsening condition including abdominal pain, fever, problems with bladder emptying/Foley catheter, or any other symptoms concerning to you.    ___________________________________________   FINAL CLINICAL IMPRESSION(S) / ED DIAGNOSES   Final diagnoses:  Urinary retention      ___________________________________________         Note: This dictation was prepared with Dragon dictation. Any transcriptional errors that result from this process are unintentional    Lisa Roca, MD 11/09/18 1212

## 2018-11-09 NOTE — ED Notes (Signed)
Bladder scan showing 0 ml of urine in triage.

## 2018-11-09 NOTE — ED Notes (Signed)
Bladder scan resulted in 0 ml from 8 different angles

## 2018-11-09 NOTE — ED Triage Notes (Signed)
Patient seen in ED this morning for urinary retention. Had catheter inserted to drain bladder. Patient now complaining pain in groin and scrotum since discharge. Reports emptying leg bag one time.

## 2018-11-09 NOTE — Discharge Instructions (Addendum)
You are evaluated for problems urinating called urinary retention, as we discussed, may be due to a pump malfunction or failure.  Leave Foley catheter in place until you discuss with your urologist.  Urine sample did not appear to have sign of urinary tract infection.  Return to the emerge department immediately for any worsening condition including abdominal pain, fever, problems with bladder emptying/Foley catheter, or any other symptoms concerning to you.

## 2018-11-09 NOTE — ED Notes (Signed)
Pt c/o groin pain from catheter, pt is in NAD, pt smiling at registration desk.

## 2018-11-09 NOTE — ED Triage Notes (Signed)
Pt states he has a artifical urinary sphincter that has been malfunctioning and today is not able to pass urine since early this morning,

## 2018-11-09 NOTE — ED Notes (Signed)
Bladder scan showed 300 ml

## 2018-11-10 ENCOUNTER — Other Ambulatory Visit: Payer: Self-pay

## 2018-11-10 ENCOUNTER — Emergency Department
Admission: EM | Admit: 2018-11-10 | Discharge: 2018-11-10 | Disposition: A | Discharge status: Home / Self Care | Payer: Medicare Other | Attending: Emergency Medicine | Admitting: Emergency Medicine

## 2018-11-10 ENCOUNTER — Ambulatory Visit (INDEPENDENT_AMBULATORY_CARE_PROVIDER_SITE_OTHER): Payer: Medicare Other | Admitting: Urology

## 2018-11-10 DIAGNOSIS — R339 Retention of urine, unspecified: Secondary | ICD-10-CM | POA: Diagnosis present

## 2018-11-10 DIAGNOSIS — R34 Anuria and oliguria: Secondary | ICD-10-CM

## 2018-11-10 DIAGNOSIS — Z960 Presence of urogenital implants: Secondary | ICD-10-CM | POA: Diagnosis not present

## 2018-11-10 DIAGNOSIS — Z79899 Other long term (current) drug therapy: Secondary | ICD-10-CM | POA: Diagnosis not present

## 2018-11-10 MED ORDER — NYSTATIN 100000 UNIT/GM EX POWD: Freq: Four times a day (QID) | CUTANEOUS | 0 refills | Status: AC

## 2018-11-10 NOTE — ED Notes (Signed)
PT reports he has been unable to void today since having a catheter removed this morning. Pt denies feeling the need to urinate. Bladder scan revealed less than 300cc of urine in pts bladder. Pt stood and attempted to urinate into a urinal but reports he still does not feel like he has to urinate. Pt given water to drink.

## 2018-11-10 NOTE — ED Triage Notes (Signed)
Pt spouse states that pt has been unable to void since 3pm - he was at PCP today and they removed catheter at 3pm - spouse is extremely agitated with care and process of being seen

## 2018-11-10 NOTE — ED Provider Notes (Signed)
[facility] Emergency Department Provider Note   ____________________________________________    I have reviewed the triage vital signs and the nursing notes.   HISTORY  Chief Complaint unable to void     HPI Alexander Hogan is a 84 y.o. male who presents today with reports of difficulty urinating.  Patient was seen recently had a Foley catheter placed, went to urology today when they removed his catheter.  He reports he has had decreased urination since then.  He does not say that he has been unable to urinate just that it is hard to urinate.  Denies dysuria.  No fevers or chills or abdominal pain.  Past Medical History:  Diagnosis Date  . Arthritis   . Gout   . High blood pressure   . Prostate cancer [facility])     Patient Active Problem List   Diagnosis Date Noted  . Goals of care, counseling/discussion 08/26/2018  . Melanoma of skin ([place]) 08/26/2018  . Personal history of prostate cancer 10/08/2017  . Stress incontinence of urine 10/08/2017    Past Surgical History:  Procedure Laterality Date  . PROSTATECTOMY  1999  . TOTAL KNEE ARTHROPLASTY  2005-2008   x2  . URINARY SPHINCTER IMPLANT      Prior to Admission medications   Medication Sig Start Date End Date Taking? Authorizing Provider  atorvastatin (LIPITOR) 80 MG tablet Take 80 mg by mouth daily at 6 PM.  04/20/16   [provider]  donepezil (ARICEPT) 10 MG tablet Take 10 mg by mouth at bedtime.  05/03/16   [provider]  furosemide (LASIX) 80 MG tablet Take 40 mg by mouth 2 (two) times daily as needed.  02/17/16   [provider]  hyoscyamine (LEVSIN SL) 0.125 MG SL tablet dissolve 1 to 2 tablets UNDER THE TONGUE every 4 hours if needed . DO NOT TAKE MORE THAN 12 TABLETS IN 24 HOURS 04/10/16   [provider]  losartan (COZAAR) 50 MG tablet Take 50 mg by mouth daily. 08/06/18   [provider]  memantine (NAMENDA) 10 MG tablet Take 10 mg by  mouth 2 (two) times daily.  04/20/16   [provider]  Meth-Hyo-M Bl-Na Phos-Ph Sal (URIBEL) 118 MG CAPS Take 118 mg by mouth daily as needed.     [provider]  metoprolol succinate (TOPROL-XL) 25 MG 24 hr tablet Take 25 mg by mouth daily.  05/01/16   [provider]  nystatin (MYCOSTATIN/NYSTOP) powder Apply topically 4 (four) times daily. 11/10/18   Billey Co, MD  potassium chloride (K-DUR) 10 MEQ tablet Take 10 mEq by mouth daily. 08/06/18   [provider]  torsemide (DEMADEX) 20 MG tablet Take 40 mg by mouth daily. 08/06/18   [provider]     Allergies Hydrocodone and Penicillins  Family History  Problem Relation Age of Onset  . Bladder Cancer Mother 48  . Kidney cancer Father 36  . Heart attack Father   . Heart Problems Sister     Social History Social History   Tobacco Use  . Smoking status: Never Smoker  . Smokeless tobacco: Never Used  Substance Use Topics  . Alcohol use: Never    Frequency: Never  . Drug use: Never    Review of Systems  Constitutional: No fever/chills Eyes: No visual changes.  ENT: No sore throat. Cardiovascular: Denies chest pain. Respiratory: Denies shortness of breath. Gastrointestinal: No abdominal pain.  No nausea, no vomiting.  Genitourinary: As above Musculoskeletal: Negative for back pain. Skin: Negative for rash. Neurological: Negative for headaches or weakness   ____________________________________________   PHYSICAL EXAM:  VITAL SIGNS: ED Triage Vitals  Enc Vitals Group     BP 11/10/18 1944 125/75     Pulse Rate 11/10/18 1944 96     Resp 11/10/18 1944 16     Temp 11/10/18 1944 99.1 F (37.3 C)     Temp Source 11/10/18 1944 Oral     SpO2 11/10/18 1944 97 %     Weight 11/10/18 1941 88.5 kg (195 lb)     Height 11/10/18 1941 1.778 m (5\' 10" )     Head Circumference --      Peak Flow --      Pain Score 11/10/18 1941 0     Pain Loc --      Pain Edu? --      Excl. in  [place]? --     Constitutional: Alert and oriented. No acute distress. Pleasant and interactive   Nose: No congestion/rhinnorhea. Mouth/Throat: Mucous membranes are moist.    Cardiovascular: Normal rate, regular rhythm.  Good peripheral circulation. Respiratory: Normal respiratory effort.  No retractions.  Gastrointestinal: Soft and nontender. No distention.  Genitourinary: Unremarkable exam Musculoskeletal:   Warm and well perfused Neurologic:  Normal speech and language. No gross focal neurologic deficits are appreciated.  Skin:  Skin is warm, dry and intact. No rash noted. Psychiatric: Mood and affect are normal. Speech and behavior are normal.  ____________________________________________   LABS (all labs ordered are listed, but only abnormal results are displayed)  Labs Reviewed  URINE CULTURE   ____________________________________________  EKG  None ____________________________________________  RADIOLOGY  Bladder scan shows 60 mL ____________________________________________   PROCEDURES  Procedure(s) performed: No  Procedures   Critical Care performed: No ____________________________________________   INITIAL IMPRESSION / ASSESSMENT AND PLAN / ED COURSE  Pertinent labs & imaging results that were available during my care of the patient were reviewed by me and considered in my medical decision making (see chart for details).  Letter scan not consistent with acute urinary retention, encouraged the patient to urinate and he was able to do so.  We will send urinary culture but at this point no further work-up necessary    ____________________________________________   FINAL CLINICAL IMPRESSION(S) / ED DIAGNOSES  Final diagnoses:  Decreased urination        Note:  This document was prepared using Dragon voice recognition software and may include unintentional dictation errors.      Lavonia Drafts, MD 11/10/18 [phone]

## 2018-11-10 NOTE — Progress Notes (Signed)
Patient present in clinic today to check AUS deactivation after foley placement in ED over the weekend. Upon examine device appeared to be re-inflated. Dr. Diamantina Providence was asked to examine as well to confirm. He was able to deactivate and instructed for a fill and pull to be done and confirm leakage and that device would not re-inflate.   Fill and Pull Catheter Removal  Patient is present today for a catheter removal.  Patient was cleaned and prepped in a sterile fashion 131ml of sterile water/ saline was instilled into the bladder when the patient felt the urge to urinate. 26ml of water was then drained from the balloon.  A 16FR foley cath was removed from the bladder no complications were noted .  Patient as then given some time to void on their own.  Patient can void  46ml on their own after some time.  Patient tolerated well.  Preformed by: Fonnie Jarvis, CMA  Follow up/ Additional notes: Per Dr. Diamantina Providence patient is to keep AUS deactivated and leak continuously and follow up at [facility]  It was noted upon exam that patient had skin irritation and break down in groin area and phimosis of penis as well. Per Patient request Dr. Diamantina Providence states ok to send in nystatin powder to help with this  Addendum: Unclear if his device is truly malfunctioning, or if he is just having difficulty manually activating the device.  He apparently was seen in the emergency department over the weekend with a bladder scan of 300 cc and a catheter was placed.  Is unclear if the device was correctly deactivated at that time.  I deactivated his device today, his bladder was filled, catheter was removed, and he immediately leaked the full amount that was instilled(200 cc).  I discussed options with him and his wife today including deactivating the device with continuous leakage or following up with [facility] where his device was placed for possible revision.  I counseled him extensively that he cannot leave a Foley catheter in place with the AUS  as this will lead to erosion and likely infection.  A total of 10 minutes were spent face-to-face with the patient, greater than 50% was spent in patient education, counseling, and coordination of care regarding malfunctioning AUS and follow-up options.

## 2018-11-11 ENCOUNTER — Telehealth: Payer: Self-pay | Admitting: Urology

## 2018-11-11 ENCOUNTER — Telehealth: Payer: Self-pay

## 2018-11-11 LAB — URINE CULTURE: Culture: NO GROWTH

## 2018-11-11 NOTE — Telephone Encounter (Signed)
Pt wife lmom stating the patient ended up going to ER last night, unable to urinate.  Pt wife asks to have someone from clinical staff to call her regarding pt's appt yesterday and last night ER visit. Please advise Joellen Jersey

## 2018-11-11 NOTE — Telephone Encounter (Signed)
Pt proceeded to ED as Call nurse recommended.

## 2018-11-11 NOTE — Telephone Encounter (Signed)
Spoke to patient, he states wife was not home. He stated he would let her know I called. Patient also states he is ok and does not need anything at this time.

## 2018-11-12 ENCOUNTER — Ambulatory Visit (INDEPENDENT_AMBULATORY_CARE_PROVIDER_SITE_OTHER): Payer: Medicare Other | Admitting: Family Medicine

## 2018-11-12 DIAGNOSIS — Z960 Presence of urogenital implants: Secondary | ICD-10-CM

## 2018-11-12 LAB — BLADDER SCAN AMB NON-IMAGING: SCAN RESULT: 0

## 2018-11-13 LAB — URINE CULTURE: Culture: >=100000 — AB

## 2018-11-16 ENCOUNTER — Other Ambulatory Visit: Payer: Self-pay

## 2018-11-16 ENCOUNTER — Emergency Department
Admission: EM | Admit: 2018-11-16 | Discharge: 2018-11-16 | Disposition: A | Discharge status: Home / Self Care | Payer: Medicare Other | Attending: Emergency Medicine | Admitting: Emergency Medicine

## 2018-11-16 DIAGNOSIS — R339 Retention of urine, unspecified: Secondary | ICD-10-CM | POA: Insufficient documentation

## 2018-11-16 DIAGNOSIS — Z85820 Personal history of malignant melanoma of skin: Secondary | ICD-10-CM | POA: Diagnosis not present

## 2018-11-16 DIAGNOSIS — N390 Urinary tract infection, site not specified: Secondary | ICD-10-CM

## 2018-11-16 DIAGNOSIS — Z8546 Personal history of malignant neoplasm of prostate: Secondary | ICD-10-CM | POA: Diagnosis not present

## 2018-11-16 DIAGNOSIS — Z79899 Other long term (current) drug therapy: Secondary | ICD-10-CM | POA: Diagnosis not present

## 2018-11-16 HISTORY — DX: Retention of urine, unspecified: R33.9

## 2018-11-16 LAB — BASIC METABOLIC PANEL
Anion gap: 10 (ref 5–15)
BUN: 32 mg/dL — ABNORMAL HIGH (ref 8–23)
CHLORIDE: 103 mmol/L (ref 98–111)
CO2: 22 mmol/L (ref 22–32)
CREATININE: 1.83 mg/dL — AB (ref 0.61–1.24)
Calcium: 9 mg/dL (ref 8.9–10.3)
GFR calc Af Amer: 36 mL/min — ABNORMAL LOW (ref >60)
GFR calc non Af Amer: 31 mL/min — ABNORMAL LOW (ref >60)
Glucose, Bld: 115 mg/dL — ABNORMAL HIGH (ref 70–99)
Potassium: 4.1 mmol/L (ref 3.5–5.1)
Sodium: 135 mmol/L (ref 135–145)

## 2018-11-16 LAB — URINALYSIS, COMPLETE (UACMP) WITH MICROSCOPIC
Bilirubin Urine: NEGATIVE
Glucose, UA: NEGATIVE mg/dL
Ketones, ur: NEGATIVE mg/dL
Nitrite: NEGATIVE
Protein, ur: NEGATIVE mg/dL
SPECIFIC GRAVITY, URINE: 1.009 (ref 1.005–1.030)
WBC, UA: >50 WBC/hpf — ABNORMAL HIGH (ref 0–5)
pH: 5 (ref 5.0–8.0)

## 2018-11-16 LAB — CBC WITH DIFFERENTIAL/PLATELET
Abs Immature Granulocytes: 0.07 10*3/uL (ref 0.00–0.07)
Basophils Absolute: 0 10*3/uL (ref 0.0–0.1)
Basophils Relative: 0 %
EOS PCT: 3 %
Eosinophils Absolute: 0.2 10*3/uL (ref 0.0–0.5)
HCT: 36.9 % — ABNORMAL LOW (ref 39.0–52.0)
Hemoglobin: 11.5 g/dL — ABNORMAL LOW (ref 13.0–17.0)
Immature Granulocytes: 1 %
Lymphocytes Relative: 8 %
Lymphs Abs: 0.8 10*3/uL (ref 0.7–4.0)
MCH: 28.7 pg (ref 26.0–34.0)
MCHC: 31.2 g/dL (ref 30.0–36.0)
MCV: 92 fL (ref 80.0–100.0)
Monocytes Absolute: 0.9 10*3/uL (ref 0.1–1.0)
Monocytes Relative: 10 %
Neutro Abs: 7.1 10*3/uL (ref 1.7–7.7)
Neutrophils Relative %: 78 %
Platelets: 164 10*3/uL (ref 150–400)
RBC: 4.01 MIL/uL — ABNORMAL LOW (ref 4.22–5.81)
RDW: 15.3 % (ref 11.5–15.5)
WBC: 9.1 10*3/uL (ref 4.0–10.5)
nRBC: 0 % (ref 0.0–0.2)

## 2018-11-16 MED ORDER — LEVOFLOXACIN 500 MG PO TABS
500.0000 mg | ORAL_TABLET | Freq: Once | ORAL | Status: AC
  Administered 2018-11-16: 500 mg via ORAL
  Filled 2018-11-16: qty 1

## 2018-11-16 MED ORDER — LEVOFLOXACIN 500 MG PO TABS: 500.0000 mg | ORAL_TABLET | Freq: Every day | ORAL | 0 refills | Status: AC

## 2018-11-16 MED ORDER — SODIUM CHLORIDE 0.9 % IV SOLN: 1.0000 g | Freq: Once | INTRAVENOUS | Status: DC

## 2018-11-16 NOTE — ED Notes (Deleted)
Pt ambulatory to room with mother at side. Pt is NAD at this time. Awaiting EDP

## 2018-11-16 NOTE — ED Provider Notes (Addendum)
[facility] Emergency Department Provider Note  ____________________________________________   I have reviewed the triage vital signs and the nursing notes. Where available I have reviewed prior notes and, if possible and indicated, outside hospital notes.    HISTORY  Chief Complaint Urinary Retention    HPI Alexander Hogan is a 84 y.o. male with a history of urinary retention was recently seen and had a Foley that was taken out a couple days ago, now he is having urinary retention and feels that he needs a Foley again.  He is able to produce all amounts of urine.  Patient is taking Uribel, and has blue urine as a result.  He denies any dysuria urinary frequency flank pain vomiting or other concerns Had decreased urination during a significant part of today he is not exactly sure how long  Past Medical History:  Diagnosis Date  . Arthritis   . Gout   . High blood pressure   . Prostate cancer ([place])   . Urinary retention     Patient Active Problem List   Diagnosis Date Noted  . Goals of care, counseling/discussion 08/26/2018  . Melanoma of skin ([place]) 08/26/2018  . Personal history of prostate cancer 10/08/2017  . Stress incontinence of urine 10/08/2017    Past Surgical History:  Procedure Laterality Date  . PROSTATECTOMY  1999  . TOTAL KNEE ARTHROPLASTY  2005-2008   x2  . URINARY SPHINCTER IMPLANT      Prior to Admission medications   Medication Sig Start Date End Date Taking? Authorizing Provider  atorvastatin (LIPITOR) 80 MG tablet Take 80 mg by mouth daily at 6 PM.  04/20/16   [provider]  donepezil (ARICEPT) 10 MG tablet Take 10 mg by mouth at bedtime.  05/03/16   [provider]  furosemide (LASIX) 80 MG tablet Take 40 mg by mouth 2 (two) times daily as needed.  02/17/16   [provider]  hyoscyamine (LEVSIN SL) 0.125 MG SL tablet dissolve 1 to 2 tablets UNDER THE TONGUE every 4 hours if needed . DO NOT TAKE MORE  THAN 12 TABLETS IN 24 HOURS 04/10/16   [provider]  losartan (COZAAR) 50 MG tablet Take 50 mg by mouth daily. 08/06/18   [provider]  memantine (NAMENDA) 10 MG tablet Take 10 mg by mouth 2 (two) times daily.  04/20/16   [provider]  Meth-Hyo-M Bl-Na Phos-Ph Sal (URIBEL) 118 MG CAPS Take 118 mg by mouth daily as needed.     [provider]  metoprolol succinate (TOPROL-XL) 25 MG 24 hr tablet Take 25 mg by mouth daily.  05/01/16   [provider]  nystatin (MYCOSTATIN/NYSTOP) powder Apply topically 4 (four) times daily. 11/10/18   Billey Co, MD  potassium chloride (K-DUR) 10 MEQ tablet Take 10 mEq by mouth daily. 08/06/18   [provider]  torsemide (DEMADEX) 20 MG tablet Take 40 mg by mouth daily. 08/06/18   [provider]    Allergies Hydrocodone and Penicillins  Family History  Problem Relation Age of Onset  . Bladder Cancer Mother 81  . Kidney cancer Father 78  . Heart attack Father   . Heart Problems Sister     Social History Social History   Tobacco Use  . Smoking status: Never Smoker  . Smokeless tobacco: Never Used  Substance Use Topics  . Alcohol use: Never    Frequency: Never  . Drug use: Never    Review of  Systems Constitutional: No fever/chills Eyes: No visual changes. ENT: No sore throat. No stiff neck no neck pain Cardiovascular: Denies chest pain. Respiratory: Denies shortness of breath. Gastrointestinal:   no vomiting.  No diarrhea.  No constipation. Genitourinary: Negative for dysuria. Musculoskeletal: Negative lower extremity swelling Skin: Negative for rash. Neurological: Negative for severe headaches, focal weakness or numbness.   ____________________________________________   PHYSICAL EXAM:  VITAL SIGNS: ED Triage Vitals  Enc Vitals Group     BP 11/16/18 1956 (!) 124/92     Pulse Rate 11/16/18 1956 (!) 121     Resp 11/16/18 1956 18     Temp 11/16/18 1956 98.8 F (37.1  C)     Temp src --      SpO2 11/16/18 1956 99 %     Weight 11/16/18 1957 195 lb 1.7 oz (88.5 kg)     Height 11/16/18 1957 5\' 10"  (1.778 m)     Head Circumference --      Peak Flow --      Pain Score 11/16/18 1957 9     Pain Loc --      Pain Edu? --      Excl. in [place]? --     Constitutional: Alert and oriented name and place unsure of the exact date.  Well appearing and in no acute distress. Eyes: Conjunctivae are normal Head: Atraumatic HEENT: No congestion/rhinnorhea. Mucous membranes are moist.  Oropharynx non-erythematous Neck:   Nontender with no meningismus, no masses, no stridor Cardiovascular: Normal rate, regular rhythm. Grossly normal heart sounds.  Good peripheral circulation. Respiratory: Normal respiratory effort.  No retractions. Lungs CTAB. Abdominal: Soft and prepubic fullness and tenderness consistent with a known 500 cc he has in his bladder. No distention. No guarding no rebound Back:  There is no focal tenderness or step off.  there is no midline tenderness there are no lesions noted. there is no CVA tenderness Normal external male genitalia not circumcised, Musculoskeletal: No lower extremity tenderness, no upper extremity tenderness. No joint effusions, no DVT signs strong distal pulses no edema Neurologic:  Normal speech and language. No gross focal neurologic deficits are appreciated.  Skin:  Skin is warm, dry and intact. No rash noted. Psychiatric: Mood and affect are normal. Speech and behavior are normal.  ____________________________________________   LABS (all labs ordered are listed, but only abnormal results are displayed)  Labs Reviewed  URINE CULTURE  URINALYSIS, COMPLETE (UACMP) WITH MICROSCOPIC  CBC WITH DIFFERENTIAL/PLATELET  BASIC METABOLIC PANEL    Pertinent labs  results that were available during my care of the patient were reviewed by me and considered in my medical decision making (see chart for  details). ____________________________________________  EKG  I personally interpreted any EKGs ordered by me or triage  ____________________________________________  RADIOLOGY  Pertinent labs & imaging results that were available during my care of the patient were reviewed by me and considered in my medical decision making (see chart for details). If possible, patient and/or family made aware of any abnormal findings.  No results found. ____________________________________________    PROCEDURES  Procedure(s) performed: None  Procedures  Critical Care performed: None  ____________________________________________   INITIAL IMPRESSION / ASSESSMENT AND PLAN / ED COURSE  Pertinent labs & imaging results that were available during my care of the patient were reviewed by me and considered in my medical decision making (see chart for details).  Patient with urinary retention, returns with urinary retention we did place a Foley got over 500 cc out  he feels much better.  Patient's urine is a blue color from the methylene blue he is taking.  His heart rate was somewhat up but he was uncomfortable and now that he is not in comfortable anticipate that will correct.  Will check kidney function basic blood work urinalysis and we will reassess  ----------------------------------------- 9:55 PM on 11/16/2018 -----------------------------------------  She does have a urinary tract infection, family and patient refused to stay for IV antibiotics, they are eager to go home.  Patient does have a recent culture which shows sensitivity penicillin to which she is allergic, as well as vancomycin nitrofurantoin and Levaquin.  We will give him Levaquin.  There is always caution to be observed in patients his age, however, Macrobid is bacteriostatic.  He states he is not on any antibiotics at this time.  Creatinine is 1.83, baseline is 2, and he does not appear to be septic.  Family and he are eager to go  home but they do consent to stay for oral Levaquin prior to discharge.  Return precautions follow-up given understood    ____________________________________________   FINAL CLINICAL IMPRESSION(S) / ED DIAGNOSES  Final diagnoses:  None      This chart was dictated using voice recognition software.  Despite best efforts to proofread,  errors can occur which can change meaning.      Schuyler Amor, MD 11/16/18 2035    Schuyler Amor, MD 11/16/18 2156

## 2018-11-16 NOTE — ED Notes (Signed)
Pt was bladder scanned and shown to more than 550 ml. Verbal order was given to place foley cath.

## 2018-11-16 NOTE — ED Triage Notes (Signed)
Urinary retention x 2 weeks, intermittently. Today patient's urine is blue. States he has a medication that turns it that color. Patient states he is having lower abdominal pain "down below".

## 2018-11-17 ENCOUNTER — Telehealth: Payer: Self-pay

## 2018-11-17 NOTE — Telephone Encounter (Signed)
Left pt message to call to get scheduled for a nurse visit foley removal tomorrow

## 2018-11-18 ENCOUNTER — Ambulatory Visit: Payer: Medicare Other | Admitting: Urology

## 2018-11-18 LAB — URINE CULTURE

## 2018-11-19 ENCOUNTER — Ambulatory Visit (INDEPENDENT_AMBULATORY_CARE_PROVIDER_SITE_OTHER): Payer: Medicare Other

## 2018-11-19 DIAGNOSIS — Z960 Presence of urogenital implants: Secondary | ICD-10-CM

## 2018-11-19 DIAGNOSIS — D638 Anemia in other chronic diseases classified elsewhere: Secondary | ICD-10-CM | POA: Insufficient documentation

## 2018-11-19 NOTE — Progress Notes (Signed)
Patient present today for a foley removal due to retention caused by AUS reactivation- accidental by patient  Catheter Removal  Patient is present today for a catheter removal.  18ml of water was drained from the balloon. A 16FR foley cath was removed from the bladder no complications were noted . Patient tolerated well.  Preformed by: Fonnie Jarvis, CMA  Follow up/ Additional notes: Patient is to keep follow up appointment with [facility] to discuss possible AUS removal  It was discussed and demonstrated in detail multiple times with patient and wife how to deactivate AUS device and explained again that the patient should leave device deactivated and he will just leak. Patient will not have a urge to urinate he will just continuously leak. If patient does get a urge to urinate and is in pain it is most likely that the device was reactivated and he and/or his wife should attempt to deactivate AUS. If unable they should contact our office to come in for evaluation.

## 2018-11-24 ENCOUNTER — Ambulatory Visit: Payer: Medicare Other | Admitting: Urology

## 2018-12-04 ENCOUNTER — Ambulatory Visit: Payer: Medicare Other

## 2018-12-05 ENCOUNTER — Other Ambulatory Visit: Payer: Self-pay | Admitting: Physician Assistant

## 2018-12-05 DIAGNOSIS — C799 Secondary malignant neoplasm of unspecified site: Secondary | ICD-10-CM

## 2018-12-05 DIAGNOSIS — C439 Malignant melanoma of skin, unspecified: Secondary | ICD-10-CM

## 2018-12-05 DIAGNOSIS — C4371 Malignant melanoma of right lower limb, including hip: Secondary | ICD-10-CM

## 2018-12-19 ENCOUNTER — Encounter
Admission: RE | Admit: 2018-12-19 | Discharge: 2018-12-19 | Disposition: A | Discharge status: Home / Self Care | Payer: Medicare Other | Source: Ambulatory Visit | Attending: Physician Assistant | Admitting: Physician Assistant

## 2018-12-19 DIAGNOSIS — C4371 Malignant melanoma of right lower limb, including hip: Secondary | ICD-10-CM | POA: Insufficient documentation

## 2018-12-19 DIAGNOSIS — C439 Malignant melanoma of skin, unspecified: Secondary | ICD-10-CM

## 2018-12-19 DIAGNOSIS — C799 Secondary malignant neoplasm of unspecified site: Secondary | ICD-10-CM | POA: Diagnosis present

## 2018-12-19 LAB — GLUCOSE, CAPILLARY: Glucose-Capillary: 99 mg/dL (ref 70–99)

## 2018-12-19 MED ORDER — FLUDEOXYGLUCOSE F - 18 (FDG) INJECTION
10.1000 | Freq: Once | INTRAVENOUS | Status: AC | PRN
  Administered 2018-12-19: 10.31 via INTRAVENOUS

## 2018-12-23 ENCOUNTER — Telehealth: Payer: Self-pay | Admitting: Urology

## 2018-12-23 NOTE — Telephone Encounter (Signed)
Pt called and wants rx refill for Cipro.

## 2018-12-23 NOTE — Telephone Encounter (Signed)
Returned call to patient and advised that he needed to call [facility] since that's where the referral was sent and they have taken over his care. [facility] phone number was given to patient. Patient became adamant that he needed an antibiotic. Advised patient that Cipro is not a medication that we fill at the patients request and a urine culture would be needed. Patient spoke with Clinical Lead, Sarah as well.  Offered to have patient have someone drop off a urine for culture as he states it is hard for him to come to the office. Patient had difficult time understanding that Cipro could not just be called in without any analysis. Judson Roch offered to call patients daughter to help clarify the situation. Patient agreed to come to the office tomorrow, Wed 12/24/18 for urine specimen dropoff.

## 2018-12-25 ENCOUNTER — Ambulatory Visit (INDEPENDENT_AMBULATORY_CARE_PROVIDER_SITE_OTHER): Payer: Medicare Other

## 2018-12-25 DIAGNOSIS — R300 Dysuria: Secondary | ICD-10-CM

## 2018-12-25 DIAGNOSIS — Z960 Presence of urogenital implants: Secondary | ICD-10-CM | POA: Diagnosis not present

## 2018-12-25 MED ORDER — CIPROFLOXACIN HCL 500 MG PO TABS: 500.0000 mg | ORAL_TABLET | Freq: Two times a day (BID) | ORAL | 0 refills | Status: DC

## 2018-12-25 MED ORDER — TRIAMCINOLONE ACETONIDE 0.5 % EX OINT: 1.0000 "application " | TOPICAL_OINTMENT | Freq: Two times a day (BID) | CUTANEOUS | 0 refills | Status: AC

## 2018-12-25 NOTE — Progress Notes (Signed)
Patient present today complaining of dysuria and not being able to urinate. Patient has a history of AUS and problems with reactivating the device. Patient has been instructed to leave AUS device deactivated and to let himself leak and follow up at [facility] for AUS removal.   Upon examine today patient is activley leaking with pad wet. Bladder scan was 61ml. Device is still deactivated. Urine was collected. Phimosis noted and patient had significant pain and discomfort. Per Dr. Diamantina Providence ok to send in cream for penis and cipro 500mg  BID. Will call patient with urine culture and change abx if necessary.   [facility] were contacted by our referral coordinator and it was requested for patient to have a sooner follow up.

## 2018-12-26 LAB — URINALYSIS, COMPLETE
BILIRUBIN UA: NEGATIVE
Glucose, UA: NEGATIVE
Ketones, UA: NEGATIVE
Nitrite, UA: NEGATIVE
Protein, UA: NEGATIVE
Specific Gravity, UA: 1.01 (ref 1.005–1.030)
UUROB: 0.2 mg/dL (ref 0.2–1.0)
pH, UA: 5.5 (ref 5.0–7.5)

## 2018-12-26 LAB — MICROSCOPIC EXAMINATION

## 2018-12-30 LAB — CULTURE, URINE COMPREHENSIVE

## 2019-02-12 ENCOUNTER — Other Ambulatory Visit: Payer: Self-pay | Admitting: Dermatology

## 2019-02-12 DIAGNOSIS — I83009 Varicose veins of unspecified lower extremity with ulcer of unspecified site: Secondary | ICD-10-CM

## 2019-02-12 DIAGNOSIS — I878 Other specified disorders of veins: Secondary | ICD-10-CM

## 2019-02-12 DIAGNOSIS — L97909 Non-pressure chronic ulcer of unspecified part of unspecified lower leg with unspecified severity: Secondary | ICD-10-CM

## 2019-02-19 ENCOUNTER — Other Ambulatory Visit: Payer: Self-pay | Admitting: Internal Medicine

## 2019-02-19 DIAGNOSIS — C439 Malignant melanoma of skin, unspecified: Secondary | ICD-10-CM

## 2019-02-19 DIAGNOSIS — C799 Secondary malignant neoplasm of unspecified site: Secondary | ICD-10-CM

## 2019-03-04 ENCOUNTER — Other Ambulatory Visit: Payer: Self-pay | Admitting: Urology

## 2019-03-04 MED ORDER — URIBEL 118 MG PO CAPS: 118.0000 mg | ORAL_CAPSULE | Freq: Every day | ORAL | 1 refills | Status: DC | PRN

## 2019-03-04 NOTE — Telephone Encounter (Signed)
Pt needs a refill on his Uribel. Please advise.

## 2019-03-04 NOTE — Addendum Note (Signed)
Addended by: Verlene Mayer A on: 03/04/2019 04:48 PM   Modules accepted: Orders

## 2019-03-09 ENCOUNTER — Telehealth: Payer: Self-pay | Admitting: Urology

## 2019-03-09 DIAGNOSIS — R300 Dysuria: Secondary | ICD-10-CM

## 2019-03-09 MED ORDER — URIBEL 118 MG PO CAPS: 118.0000 mg | ORAL_CAPSULE | Freq: Every day | ORAL | 6 refills | Status: AC | PRN

## 2019-03-09 NOTE — Telephone Encounter (Signed)
RX sent

## 2019-03-09 NOTE — Telephone Encounter (Signed)
Pt called and needs a refill for Uribel. Please advise

## 2019-03-09 NOTE — Addendum Note (Signed)
Addended by: Donalee Citrin on: 03/09/2019 04:44 PM   Modules accepted: Orders

## 2019-04-02 ENCOUNTER — Other Ambulatory Visit: Payer: Self-pay

## 2019-04-02 ENCOUNTER — Ambulatory Visit
Admission: RE | Admit: 2019-04-02 | Discharge: 2019-04-02 | Disposition: A | Discharge status: Home / Self Care | Payer: Medicare Other | Source: Ambulatory Visit | Attending: Internal Medicine | Admitting: Internal Medicine

## 2019-04-02 DIAGNOSIS — C792 Secondary malignant neoplasm of skin: Secondary | ICD-10-CM | POA: Diagnosis not present

## 2019-04-02 DIAGNOSIS — C439 Malignant melanoma of skin, unspecified: Secondary | ICD-10-CM

## 2019-04-02 DIAGNOSIS — I700 Atherosclerosis of aorta: Secondary | ICD-10-CM | POA: Insufficient documentation

## 2019-04-02 DIAGNOSIS — R590 Localized enlarged lymph nodes: Secondary | ICD-10-CM | POA: Insufficient documentation

## 2019-04-02 DIAGNOSIS — C799 Secondary malignant neoplasm of unspecified site: Secondary | ICD-10-CM | POA: Diagnosis present

## 2019-04-02 LAB — GLUCOSE, CAPILLARY: Glucose-Capillary: 99 mg/dL (ref 70–99)

## 2019-04-02 MED ORDER — FLUDEOXYGLUCOSE F - 18 (FDG) INJECTION
10.0800 | Freq: Once | INTRAVENOUS | Status: AC | PRN
  Administered 2019-04-02: 10.08 via INTRAVENOUS

## 2019-05-21 ENCOUNTER — Other Ambulatory Visit: Payer: Self-pay

## 2019-05-21 ENCOUNTER — Encounter: Payer: Medicare Other | Attending: Physician Assistant | Admitting: Physician Assistant

## 2019-05-21 DIAGNOSIS — Z836 Family history of other diseases of the respiratory system: Secondary | ICD-10-CM | POA: Insufficient documentation

## 2019-05-21 DIAGNOSIS — E785 Hyperlipidemia, unspecified: Secondary | ICD-10-CM | POA: Insufficient documentation

## 2019-05-21 DIAGNOSIS — Z8546 Personal history of malignant neoplasm of prostate: Secondary | ICD-10-CM | POA: Insufficient documentation

## 2019-05-21 DIAGNOSIS — Z880 Allergy status to penicillin: Secondary | ICD-10-CM | POA: Insufficient documentation

## 2019-05-21 DIAGNOSIS — Z8249 Family history of ischemic heart disease and other diseases of the circulatory system: Secondary | ICD-10-CM | POA: Insufficient documentation

## 2019-05-21 DIAGNOSIS — Z885 Allergy status to narcotic agent status: Secondary | ICD-10-CM | POA: Insufficient documentation

## 2019-05-21 DIAGNOSIS — L97822 Non-pressure chronic ulcer of other part of left lower leg with fat layer exposed: Secondary | ICD-10-CM | POA: Diagnosis not present

## 2019-05-21 DIAGNOSIS — N183 Chronic kidney disease, stage 3 (moderate): Secondary | ICD-10-CM | POA: Diagnosis not present

## 2019-05-21 DIAGNOSIS — I872 Venous insufficiency (chronic) (peripheral): Secondary | ICD-10-CM | POA: Diagnosis not present

## 2019-05-21 DIAGNOSIS — Z809 Family history of malignant neoplasm, unspecified: Secondary | ICD-10-CM | POA: Diagnosis not present

## 2019-05-21 DIAGNOSIS — I2510 Atherosclerotic heart disease of native coronary artery without angina pectoris: Secondary | ICD-10-CM | POA: Diagnosis not present

## 2019-05-21 DIAGNOSIS — Z85820 Personal history of malignant melanoma of skin: Secondary | ICD-10-CM | POA: Insufficient documentation

## 2019-05-21 DIAGNOSIS — I129 Hypertensive chronic kidney disease with stage 1 through stage 4 chronic kidney disease, or unspecified chronic kidney disease: Secondary | ICD-10-CM | POA: Diagnosis not present

## 2019-05-21 NOTE — Progress Notes (Signed)
RUFFIN, LADA ([id number]) Visit Report for 05/21/2019 Abuse/Suicide Risk Screen Details Patient Name: Alexander Hogan, Alexander Hogan Date of Service: 05/21/2019 8:30 AM Medical Record Number: [id number] Patient Account Number: [id number] Date of Birth/Sex: 12-23-1921 (85 y.o. M) Treating RN: Montey Hora Primary Care Audria Takeshita: Dion Body Other Clinician: Referring Lucila Klecka: Dion Body Treating Micheal Sheen/Extender: Melburn Hake, HOYT Weeks in Treatment: 0 Abuse/Suicide Risk Screen Items Answer ABUSE RISK SCREEN: Has anyone close to you tried to hurt or harm you recentlyo No Do you feel uncomfortable with anyone in your familyo No Has anyone forced you do things that you didnot want to doo No Electronic Signature(s) Signed: 05/21/2019 1:50:58 PM By: Montey Hora Entered By: Montey Hora on 05/21/2019 09:05:45 Alexander Hogan ([id number]) -------------------------------------------------------------------------------- Activities of Daily Living Details Patient Name: Alexander Hogan Date of Service: 05/21/2019 8:30 AM Medical Record Number: [id number] Patient Account Number: [id number] Date of Birth/Sex: 1922/03/12 (85 y.o. M) Treating RN: Montey Hora Primary Care Jaree Trinka: Dion Body Other Clinician: Referring Auston Halfmann: Dion Body Treating Esdras Delair/Extender: Melburn Hake, HOYT Weeks in Treatment: 0 Activities of Daily Living Items Answer Activities of Daily Living (Please select one for each item) Drive Automobile Not Able Take Medications Completely Able Use Telephone Completely Able Care for Appearance Completely Able Use Toilet Completely Able Bath / Shower Completely Able Dress Self Completely Able Feed Self Completely Able Walk Need Assistance Get In / Out Bed Completely [place] Need Assistance Shop for Self Need Assistance Electronic Signature(s) Signed: 05/21/2019 1:50:58 PM  By: Montey Hora Entered By: Montey Hora on 05/21/2019 09:06:39 Alexander Hogan ([id number]) -------------------------------------------------------------------------------- Education Screening Details Patient Name: Alexander Hogan Date of Service: 05/21/2019 8:30 AM Medical Record Number: [id number] Patient Account Number: [id number] Date of Birth/Sex: 16-Jan-1922 (85 y.o. M) Treating RN: Montey Hora Primary Care Laurinda Carreno: Dion Body Other Clinician: Referring Diania Co: Dion Body Treating Idriss Quackenbush/Extender: Sharalyn Ink in Treatment: 0 Primary Learner Assessed: Patient Learning Preferences/Education Level/Primary Language Learning Preference: Explanation, Demonstration Highest Education Level: College or Above Preferred Language: English Cognitive Barrier Language Barrier: No Translator Needed: No Memory Deficit: No Emotional Barrier: No Cultural/Religious Beliefs Affecting Medical Care: No Physical Barrier Impaired Vision: No Impaired Hearing: No Decreased Hand dexterity: No Knowledge/Comprehension Knowledge Level: Medium Comprehension Level: Medium Ability to understand written Medium instructions: Ability to understand verbal Medium instructions: Motivation Anxiety Level: Calm Cooperation: Cooperative Education Importance: Acknowledges Need Interest in Health Problems: Asks Questions Perception: Coherent Willingness to Engage in Self- Medium Management Activities: Readiness to Engage in Self- Medium Management Activities: Electronic Signature(s) Signed: 05/21/2019 1:50:58 PM By: Montey Hora Entered By: Montey Hora on 05/21/2019 09:07:29 Alexander Hogan ([id number]) -------------------------------------------------------------------------------- Fall Risk Assessment Details Patient Name: Alexander Hogan Date of Service: 05/21/2019 8:30 AM Medical Record Number: [id number] Patient Account Number: [id number] Date  of Birth/Sex: [birth year] (85 y.o. M) Treating RN: Montey Hora Primary Care Leza Apsey: Dion Body Other Clinician: Referring Staphany Ditton: Dion Body Treating Kejuan Bekker/Extender: Melburn Hake, HOYT Weeks in Treatment: 0 Fall Risk Assessment Items Have you had 2 or more falls in the last 12 monthso 0 No Have you had any fall that resulted in injury in the last 12 monthso 0 No FALLS RISK SCREEN History of falling - immediate or within 3 months 0 No Secondary diagnosis (Do you have 2 or more medical diagnoseso) 0 No Ambulatory aid None/bed rest/wheelchair/nurse 0 No Crutches/cane/walker 15 Yes Furniture 0 No Intravenous therapy Access/Saline/Heparin Lock 0 No Gait/Transferring Normal/ bed rest/ wheelchair 0 No  Weak (short steps with or without shuffle, stooped but able to lift head while 10 Yes walking, may seek support from furniture) Impaired (short steps with shuffle, may have difficulty arising from chair, head 0 No down, impaired balance) Mental Status Oriented to own ability 0 Yes Electronic Signature(s) Signed: 05/21/2019 1:50:58 PM By: Montey Hora Entered By: Montey Hora on 05/21/2019 09:08:07 Alexander Hogan ([id number]) -------------------------------------------------------------------------------- Foot Assessment Details Patient Name: Alexander Hogan Date of Service: 05/21/2019 8:30 AM Medical Record Number: [id number] Patient Account Number: [id number] Date of Birth/Sex: 1921/12/13 (85 y.o. M) Treating RN: Montey Hora Primary Care Ahmaud Duthie: Dion Body Other Clinician: Referring Fayette Hamada: Dion Body Treating Shanyn Preisler/Extender: Melburn Hake, HOYT Weeks in Treatment: 0 Foot Assessment Items Site Locations + = Sensation present, - = Sensation absent, C = Callus, U = Ulcer R = Redness, W = Warmth, M = Maceration, PU = Pre-ulcerative lesion F = Fissure, S = Swelling, D = Dryness Assessment Right: Left: Other Deformity: No No Prior  Foot Ulcer: No No Prior Amputation: No No Charcot Joint: No No Ambulatory Status: Ambulatory With Help Assistance Device: Walker Gait: Steady Electronic Signature(s) Signed: 05/21/2019 1:50:58 PM By: Montey Hora Entered By: Montey Hora on 05/21/2019 09:08:57 Alexander Hogan ([id number]) -------------------------------------------------------------------------------- Nutrition Risk Screening Details Patient Name: Alexander Hogan Date of Service: 05/21/2019 8:30 AM Medical Record Number: [id number] Patient Account Number: [id number] Date of Birth/Sex: 01/28/1922 (85 y.o. M) Treating RN: Montey Hora Primary Care Amit Meloy: Dion Body Other Clinician: Referring Masiel Gentzler: Dion Body Treating Giamarie Bueche/Extender: Melburn Hake, HOYT Weeks in Treatment: 0 Height (in): 70 Weight (lbs): 198 Body Mass Index (BMI): 28.4 Nutrition Risk Screening Items Score Screening NUTRITION RISK SCREEN: I have an illness or condition that made me change the kind and/or amount of 0 No food I eat I eat fewer than two meals per day 0 No I eat few fruits and vegetables, or milk products 0 No I have three or more drinks of beer, liquor or wine almost every day 0 No I have tooth or mouth problems that make it hard for me to eat 0 No I don't always have enough money to buy the food I need 0 No I eat alone most of the time 0 No I take three or more different prescribed or over-the-counter drugs a day 1 Yes Without wanting to, I have lost or gained 10 pounds in the last six months 0 No I am not always physically able to shop, cook and/or feed myself 0 No Nutrition Protocols Good Risk Protocol 0 No interventions needed Moderate Risk Protocol High Risk Proctocol Risk Level: Good Risk Score: 1 Electronic Signature(s) Signed: 05/21/2019 1:50:58 PM By: Montey Hora Entered By: Montey Hora on 05/21/2019 94:76:54

## 2019-05-22 NOTE — Progress Notes (Signed)
KASHTYN, JANKOWSKI ([id number]) Visit Report for 05/21/2019 Allergy List Details Patient Name: ALEE, KATEN Date of Service: 05/21/2019 8:30 AM Medical Record Number: [id number] Patient Account Number: [id number] Date of Birth/Sex: [birth year] (85 y.o. M) Treating RN: Montey Hora Primary Care Jalayna Josten: Dion Body Other Clinician: Referring Gayleen Sholtz: Dion Body Treating Lashuna Tamashiro/Extender: Melburn Hake, HOYT Weeks in Treatment: 0 Allergies Active Allergies penicillin Reaction: rash hydrocodone Allergy Notes Electronic Signature(s) Signed: 05/21/2019 1:50:58 PM By: Montey Hora Entered By: Montey Hora on 05/21/2019 09:05:34 [name] ([id number]) -------------------------------------------------------------------------------- Arrival Information Details Patient Name: [name] Date of Service: 05/21/2019 8:30 AM Medical Record Number: [id number] Patient Account Number: [id number] Date of Birth/Sex: 10-Feb-1922 (85 y.o. M) Treating RN: Montey Hora Primary Care Jaber Dunlow: Dion Body Other Clinician: Referring Mitzy Naron: Dion Body Treating Cornelius Schuitema/Extender: Melburn Hake, HOYT Weeks in Treatment: 0 Visit Information Patient Arrived: Wheel Chair Arrival Time: 08:57 Accompanied By: wife Transfer Assistance: None Patient Identification Verified: Yes Secondary Verification Process Completed: Yes Electronic Signature(s) Signed: 05/21/2019 1:50:58 PM By: Montey Hora Entered By: Montey Hora on 05/21/2019 08:59:02 [name] ([id number]) -------------------------------------------------------------------------------- Clinic Level of Care Assessment Details Patient Name: [name] Date of Service: 05/21/2019 8:30 AM Medical Record Number: [id number] Patient Account Number: [id number] Date of Birth/Sex: [birth year] (85 y.o. M) Treating RN: Army Melia Primary Care Linkoln Alkire: Dion Body Other  Clinician: Referring Meigan Pates: Dion Body Treating Yashua Bracco/Extender: Melburn Hake, HOYT Weeks in Treatment: 0 Clinic Level of Care Assessment Items TOOL 2 Quantity Score []  - Use when only an EandM is performed on the INITIAL visit 0 ASSESSMENTS - Nursing Assessment / Reassessment X - General Physical Exam (combine w/ comprehensive assessment (listed just below) when 1 20 performed on new pt. evals) X- 1 25 Comprehensive Assessment (HX, ROS, Risk Assessments, Wounds Hx, etc.) ASSESSMENTS - Wound and Skin Assessment / Reassessment X - Simple Wound Assessment / Reassessment - one wound 1 5 []  - 0 Complex Wound Assessment / Reassessment - multiple wounds []  - 0 Dermatologic / Skin Assessment (not related to wound area) ASSESSMENTS - Ostomy and/or Continence Assessment and Care []  - Incontinence Assessment and Management 0 []  - 0 Ostomy Care Assessment and Management (repouching, etc.) PROCESS - Coordination of Care X - Simple Patient / Family Education for ongoing care 1 15 []  - 0 Complex (extensive) Patient / Family Education for ongoing care []  - 0 Staff obtains Programmer, systems, Records, Test Results / Process Orders []  - 0 Staff telephones HHA, Nursing Homes / Clarify orders / etc []  - 0 Routine Transfer to another Facility (non-emergent condition) []  - 0 Routine Hospital Admission (non-emergent condition) []  - 0 New Admissions / Biomedical engineer / Ordering NPWT, Apligraf, etc. []  - 0 Emergency Hospital Admission (emergent condition) X- 1 10 Simple Discharge Coordination []  - 0 Complex (extensive) Discharge Coordination PROCESS - Special Needs []  - Pediatric / Minor Patient Management 0 []  - 0 Isolation Patient Management CLEVON, KHADER ([id number]) []  - 0 Hearing / Language / Visual special needs []  - 0 Assessment of Community assistance (transportation, D/C planning, etc.) []  - 0 Additional assistance / Altered mentation []  - 0 Support Surface(s)  Assessment (bed, cushion, seat, etc.) INTERVENTIONS - Wound Cleansing / Measurement X - Wound Imaging (photographs - any number of wounds) 1 5 []  - 0 Wound Tracing (instead of photographs) X- 1 5 Simple Wound Measurement - one wound []  - 0 Complex Wound Measurement - multiple wounds X- 1 5 Simple Wound Cleansing - one wound []  - 0  Complex Wound Cleansing - multiple wounds INTERVENTIONS - Wound Dressings []  - Small Wound Dressing one or multiple wounds 0 X- 1 15 Medium Wound Dressing one or multiple wounds []  - 0 Large Wound Dressing one or multiple wounds []  - 0 Application of Medications - injection INTERVENTIONS - Miscellaneous []  - External ear exam 0 []  - 0 Specimen Collection (cultures, biopsies, blood, body fluids, etc.) []  - 0 Specimen(s) / Culture(s) sent or taken to Lab for analysis []  - 0 Patient Transfer (multiple staff / Harrel Lemon Lift / Similar devices) []  - 0 Simple Staple / Suture removal (25 or less) []  - 0 Complex Staple / Suture removal (26 or more) []  - 0 Hypo / Hyperglycemic Management (close monitor of Blood Glucose) []  - 0 Ankle / Brachial Index (ABI) - do not check if billed separately Has the patient been seen at the hospital within the last three years: Yes Total Score: 105 Level Of Care: New/Established - Level 3 Electronic Signature(s) Signed: 05/21/2019 3:38:22 PM By: Army Melia Entered By: Army Melia on 05/21/2019 09:51:57 [name] ([id number]) -------------------------------------------------------------------------------- Compression Therapy Details Patient Name: [name] Date of Service: 05/21/2019 8:30 AM Medical Record Number: [id number] Patient Account Number: [id number] Date of Birth/Sex: 1922/07/19 (85 y.o. M) Treating RN: Army Melia Primary Care Kenwood Rosiak: Dion Body Other Clinician: Referring Ruthy Forry: Dion Body Treating Sharonlee Nine/Extender: Melburn Hake, HOYT Weeks in Treatment: 0 Compression  Therapy Performed for Wound Assessment: Wound #1 Left,Medial Lower Leg Performed By: Clinician Army Melia, RN Compression Type: Three Layer Pre Treatment ABI: 1 Post Procedure Diagnosis Same as Pre-procedure Electronic Signature(s) Signed: 05/21/2019 3:38:22 PM By: Army Melia Entered By: Army Melia on 05/21/2019 09:47:30 [name] ([id number]) -------------------------------------------------------------------------------- Encounter Discharge Information Details Patient Name: [name] Date of Service: 05/21/2019 8:30 AM Medical Record Number: [id number] Patient Account Number: [id number] Date of Birth/Sex: [birth year] (85 y.o. M) Treating RN: Army Melia Primary Care Keyon Winnick: Dion Body Other Clinician: Referring Mccade Sullenberger: Dion Body Treating Daxtin Leiker/Extender: Melburn Hake, HOYT Weeks in Treatment: 0 Encounter Discharge Information Items Discharge Condition: Stable Ambulatory Status: Ambulatory Discharge Destination: Home Transportation: Private Auto Accompanied By: wife Schedule Follow-up Appointment: Yes Clinical Summary of Care: Electronic Signature(s) Signed: 05/21/2019 3:38:22 PM By: Army Melia Entered By: Army Melia on 05/21/2019 09:53:25 [name] ([id number]) -------------------------------------------------------------------------------- Lower Extremity Assessment Details Patient Name: [name] Date of Service: 05/21/2019 8:30 AM Medical Record Number: [id number] Patient Account Number: [id number] Date of Birth/Sex: 15-Feb-1922 (85 y.o. M) Treating RN: Montey Hora Primary Care Jatavia Keltner: Dion Body Other Clinician: Referring Lynette Noah: Dion Body Treating Mala Gibbard/Extender: Melburn Hake, HOYT Weeks in Treatment: 0 Edema Assessment Assessed: [Left: No] [Right: No] Edema: [Left: Ye] [Right: s] Calf Left: Right: Point of Measurement: 34 cm From Medial Instep 37 cm cm Ankle Left: Right: Point of  Measurement: 13 cm From Medial Instep 32.5 cm cm Vascular Assessment Pulses: Dorsalis Pedis Palpable: [Left:No] Doppler Audible: [Left:Inaudible] Posterior Tibial Palpable: [Left:No] Doppler Audible: [Left:Yes] Blood Pressure: Brachial: [Left:128] Ankle: [Left:Posterior Tibial: 122 0.95] Electronic Signature(s) Signed: 05/21/2019 1:50:58 PM By: Montey Hora Entered By: Montey Hora on 05/21/2019 09:27:22 [name] ([id number]) -------------------------------------------------------------------------------- Multi Wound Chart Details Patient Name: [name] Date of Service: 05/21/2019 8:30 AM Medical Record Number: [id number] Patient Account Number: [id number] Date of Birth/Sex: 1922-10-28 (85 y.o. M) Treating RN: Army Melia Primary Care Tyler Cubit: Dion Body Other Clinician: Referring Bev Drennen: Dion Body Treating Carson Meche/Extender: Melburn Hake, HOYT Weeks in Treatment: 0 Vital Signs Height(in): 70 Pulse(bpm): 43 Weight(lbs): 198  Blood Pressure(mmHg): 122/48 Body Mass Index(BMI): 28 Temperature(F): 98.7 Respiratory Rate 18 (breaths/min): Photos: [N/A:N/A] Wound Location: Left Lower Leg - Medial N/A N/A Wounding Event: Surgical Injury N/A N/A Primary Etiology: Venous Leg Ulcer N/A N/A Comorbid History: Anemia, Coronary Artery N/A N/A Disease, Hypertension, Received Chemotherapy, Received Radiation Date Acquired: 02/01/2019 N/A N/A Weeks of Treatment: 0 N/A N/A Wound Status: Open N/A N/A Measurements L x W x D 0.6x0.9x0.1 N/A N/A (cm) Area (cm) : 0.424 N/A N/A Volume (cm) : 0.042 N/A N/A Classification: Full Thickness Without N/A N/A Exposed Support Structures Exudate Amount: Medium N/A N/A Exudate Type: Serous N/A N/A Exudate Color: amber N/A N/A Wound Margin: Flat and Intact N/A N/A Granulation Amount: Medium (34-66%) N/A N/A Granulation Quality: Red N/A N/A Necrotic Amount: Medium (34-66%) N/A N/A Exposed Structures:  Fat Layer (Subcutaneous N/A N/A Tissue) Exposed: Yes Fascia: No Tendon: No Muscle: No EUGUNE, SINE ([id number]) Joint: No Bone: No Epithelialization: None N/A N/A Treatment Notes Electronic Signature(s) Signed: 05/21/2019 3:38:22 PM By: Army Melia Entered By: Army Melia on 05/21/2019 09:44:59 [name] ([id number]) -------------------------------------------------------------------------------- [place] Details Patient Name: [name] Date of Service: 05/21/2019 8:30 AM Medical Record Number: [id number] Patient Account Number: [id number] Date of Birth/Sex: 1922-04-20 (85 y.o. M) Treating RN: Army Melia Primary Care Taksh Hjort: Dion Body Other Clinician: Referring Delena Casebeer: Dion Body Treating Ayauna Mcnay/Extender: Melburn Hake, HOYT Weeks in Treatment: 0 Active Inactive Orientation to the Wound Care Program Nursing Diagnoses: Knowledge deficit related to the wound healing center program Goals: Patient/caregiver will verbalize understanding of the [place] Program Date Initiated: 05/21/2019 Target Resolution Date: 05/21/2019 Goal Status: Active Interventions: Provide education on orientation to the wound center Notes: Wound/Skin Impairment Nursing Diagnoses: Impaired tissue integrity Goals: Ulcer/skin breakdown will have a volume reduction of 30% by week 4 Date Initiated: 05/21/2019 Target Resolution Date: 06/18/2019 Goal Status: Active Interventions: Assess patient/caregiver ability to obtain necessary supplies Assess patient/caregiver ability to perform ulcer/skin care regimen upon admission and as needed Notes: Electronic Signature(s) Signed: 05/21/2019 3:38:22 PM By: Army Melia Entered By: Army Melia on 05/21/2019 09:44:50 [name] ([id number]) -------------------------------------------------------------------------------- Pain Assessment Details Patient Name: [name] Date  of Service: 05/21/2019 8:30 AM Medical Record Number: [id number] Patient Account Number: [id number] Date of Birth/Sex: 26-Jun-1922 (85 y.o. M) Treating RN: Montey Hora Primary Care Dora Simeone: Dion Body Other Clinician: Referring Marygrace Sandoval: Dion Body Treating Eliya Bubar/Extender: Melburn Hake, HOYT Weeks in Treatment: 0 Active Problems Location of Pain Severity and Description of Pain Patient Has Paino No Site Locations Pain Management and Medication Current Pain Management: Electronic Signature(s) Signed: 05/21/2019 1:50:58 PM By: Montey Hora Entered By: Montey Hora on 05/21/2019 08:59:29 [name] ([id number]) -------------------------------------------------------------------------------- Patient/Caregiver Education Details Patient Name: [name] Date of Service: 05/21/2019 8:30 AM Medical Record Number: [id number] Patient Account Number: [id number] Date of Birth/Gender: May 04, 1922 (85 y.o. M) Treating RN: Army Melia Primary Care Physician: Dion Body Other Clinician: Referring Physician: Dion Body Treating Physician/Extender: Sharalyn Ink in Treatment: 0 Education Assessment Education Provided To: Patient Education Topics Provided Wound/Skin Impairment: Handouts: Caring for Your Ulcer Methods: Demonstration, Explain/Verbal Responses: State content correctly Electronic Signature(s) Signed: 05/21/2019 3:38:22 PM By: Army Melia Entered By: Army Melia on 05/21/2019 09:52:25 [name] ([id number]) -------------------------------------------------------------------------------- Wound Assessment Details Patient Name: [name] Date of Service: 05/21/2019 8:30 AM Medical Record Number: [id number] Patient Account Number: [id number] Date of Birth/Sex: Apr 26, 1922 (85 y.o. M) Treating RN: Montey Hora Primary Care Manoah Deckard: Dion Body Other Clinician:  Referring Isobella Ascher: Dion Body Treating Cade Olberding/Extender: Melburn Hake, HOYT Weeks in Treatment: 0 Wound Status Wound Number: 1 Primary Venous Leg Ulcer Etiology: Wound Location: Left Lower Leg - Medial Wound Open Wounding Event: Surgical Injury Status: Date Acquired: 02/01/2019 Comorbid Anemia, Coronary Artery Disease, Weeks Of Treatment: 0 History: Hypertension, Received Chemotherapy, Clustered Wound: No Received Radiation Photos Wound Measurements Length: (cm) 0.6 Width: (cm) 0.9 Depth: (cm) 0.1 Area: (cm) 0.424 Volume: (cm) 0.042 % Reduction in Area: % Reduction in Volume: Epithelialization: None Tunneling: No Undermining: No Wound Description Full Thickness Without Exposed Support Foul Odo Classification: Structures Slough/F Wound Margin: Flat and Intact Exudate Medium Amount: Exudate Type: Serous Exudate Color: amber r After Cleansing: No ibrino Yes Wound Bed Granulation Amount: Medium (34-66%) Exposed Structure Granulation Quality: Red Fascia Exposed: No Necrotic Amount: Medium (34-66%) Fat Layer (Subcutaneous Tissue) Exposed: Yes Necrotic Quality: Adherent Slough Tendon Exposed: No Muscle Exposed: No Joint Exposed: No Bone Exposed: No KHALIN, ROYCE ([id number]) Treatment Notes Wound #1 (Left, Medial Lower Leg) Notes iodoflex, ABD, 3 layer Electronic Signature(s) Signed: 05/21/2019 1:50:58 PM By: Montey Hora Entered By: Montey Hora on 05/21/2019 09:20:40 [name] ([id number]) -------------------------------------------------------------------------------- [place] Details Patient Name: [name] Date of Service: 05/21/2019 8:30 AM Medical Record Number: [id number] Patient Account Number: [id number] Date of Birth/Sex: July 15, 1922 (85 y.o. M) Treating RN: Montey Hora Primary Care Jerame Hedding: Dion Body Other Clinician: Referring Lupe Bonner: Dion Body Treating Lamarion Mcevers/Extender: Melburn Hake, HOYT Weeks in Treatment: 0 Vital  Signs Time Taken: 08:59 Temperature (F): 98.7 Height (in): 70 Pulse (bpm): 43 Source: Measured Respiratory Rate (breaths/min): 18 Weight (lbs): 198 Blood Pressure (mmHg): 122/48 Source: Measured Reference Range: 80 - 120 mg / dl Body Mass Index (BMI): 28.4 Electronic Signature(s) Signed: 05/21/2019 1:50:58 PM By: Montey Hora Entered By: Montey Hora on 05/21/2019 09:01:18

## 2019-05-22 NOTE — Progress Notes (Signed)
DAMEL, QUERRY ([id number]) Visit Report for 05/21/2019 Chief Complaint Document Details Patient Name: Alexander Hogan, Alexander Hogan Date of Service: 05/21/2019 8:30 AM Medical Record Number: [id number] Patient Account Number: [id number] Date of Birth/Sex: 15-Oct-1922 (85 y.o. M) Treating RN: Army Melia Primary Care Provider: Dion Body Other Clinician: Referring Provider: Dion Body Treating Provider/Extender: Melburn Hake, Garet Hooton Weeks in Treatment: 0 Information Obtained from: Patient Chief Complaint Left LE Ulcer Electronic Signature(s) Signed: 05/22/2019 1:16:38 PM By: Worthy Keeler PA-C Entered By: Worthy Keeler on 05/21/2019 09:38:59 Alexander Hogan ([id number]) -------------------------------------------------------------------------------- HPI Details Patient Name: Alexander Hogan Date of Service: 05/21/2019 8:30 AM Medical Record Number: [id number] Patient Account Number: [id number] Date of Birth/Sex: 02-28-1922 (85 y.o. M) Treating RN: Army Melia Primary Care Provider: Dion Body Other Clinician: Referring Provider: Dion Body Treating Provider/Extender: Melburn Hake, Essica Kiker Weeks in Treatment: 0 History of Present Illness HPI Description: 05/21/19 on evaluation today patient actually appears to be doing poorly in regard to his left lower extremity in regard to a small area where there is some question of what exactly happened. This is the initial evaluation here in our clinic but apparently the patient did have a surgical procedure to remove something that was questionable skin cancer. With that being said according to what the wife of the patient tell me this did not seem to end up being the case and he's just had a hard time getting this area to heal. Fortunately there's no signs of active infection at this time. He does have a history of metastatic melanoma of the right lower extremity he goes to [facility] and is being treated there on a regular basis or  at least was until the Covid-19 Virus pandemic. He also has a history of hypertension and chronic kidney disease stage III. Electronic Signature(s) Signed: 05/22/2019 1:16:38 PM By: Worthy Keeler PA-C Entered By: Worthy Keeler on 05/22/2019 10:58:17 Alexander Hogan ([id number]) -------------------------------------------------------------------------------- Physical Exam Details Patient Name: Alexander Hogan Date of Service: 05/21/2019 8:30 AM Medical Record Number: [id number] Patient Account Number: [id number] Date of Birth/Sex: 12-Nov-1922 (85 y.o. M) Treating RN: Army Melia Primary Care Provider: Dion Body Other Clinician: Referring Provider: Dion Body Treating Provider/Extender: Melburn Hake, Gennaro Lizotte Weeks in Treatment: 0 Constitutional sitting or standing blood pressure is within target range for patient.. pulse regular and within target range for patient.Marland Kitchen respirations regular, non-labored and within target range for patient.Marland Kitchen temperature within target range for patient.. Well- nourished and well-hydrated in no acute distress. Eyes conjunctiva clear no eyelid edema noted. pupils equal round and reactive to light and accommodation. Ears, Nose, Mouth, and Throat no gross abnormality of ear auricles or external auditory canals. normal hearing noted during conversation. mucus membranes moist. Respiratory normal breathing without difficulty. clear to auscultation bilaterally. Cardiovascular regular rate and rhythm with normal S1, S2. 1+ pitting edema of the bilateral lower extremities. Gastrointestinal (GI) soft, non-tender, non-distended, +BS. no ventral hernia noted. Musculoskeletal Patient unable to walk without assistance. no significant deformity or arthritic changes, no loss or range of motion, no clubbing. Psychiatric this patient is able to make decisions and demonstrates good insight into disease process. Alert and Oriented x 3. pleasant and  cooperative. Notes Upon inspection today patient's wound bed actually did not appear to be too bad he did have some healing although there was some extremely inherent Slough noted in the central portion of the wound as well which was somewhat tender to touch but nothing too significant. There did not appear to be  any signs of infection. No fevers, chills, nausea, or vomiting noted at this time. It also does not appear that the wound goes to deeply although I do think using something on the wound to try to help soften this up would definitely be beneficial. The patient is in agreement with this plan. The short reports performed as I'm not sure that again with this history I want to do this until I can get a result from the biopsy that shows that this indeed was negative for any malignancy. Nonetheless it seems to be healing well I'm hopeful that is not the case. Electronic Signature(s) Signed: 05/22/2019 1:16:38 PM By: Worthy Keeler PA-C Entered By: Worthy Keeler on 05/22/2019 10:59:14 Alexander Hogan ([id number]) -------------------------------------------------------------------------------- Physician Orders Details Patient Name: Alexander Hogan Date of Service: 05/21/2019 8:30 AM Medical Record Number: [id number] Patient Account Number: [id number] Date of Birth/Sex: [birth year] (85 y.o. M) Treating RN: Army Melia Primary Care Provider: Dion Body Other Clinician: Referring Provider: Dion Body Treating Provider/Extender: Melburn Hake, Avereigh Spainhower Weeks in Treatment: 0 Verbal / Phone Orders: No Diagnosis Coding ICD-10 Coding Code Description I87.2 Venous insufficiency (chronic) (peripheral) L97.822 Non-pressure chronic ulcer of other part of left lower leg with fat layer exposed I10 Essential (primary) hypertension N18.3 Chronic kidney disease, stage 3 (moderate) C43.9 Malignant melanoma of skin, unspecified Wound Cleansing Wound #1 Left,Medial Lower Leg o Clean  wound with Normal Saline. Primary Wound Dressing Wound #1 Left,Medial Lower Leg o Iodoflex Secondary Dressing Wound #1 Left,Medial Lower Leg o ABD pad Dressing Change Frequency Wound #1 Left,Medial Lower Leg o Dressing is to be changed Monday and Thursday. Follow-up Appointments Wound #1 Left,Medial Lower Leg o Return Appointment in 1 week. o Nurse Visit as needed - Monday 6/22 Edema Control o 3 Layer Compression System - Left Lower Extremity Electronic Signature(s) Signed: 05/21/2019 3:38:22 PM By: Army Melia Signed: 05/22/2019 1:16:38 PM By: Worthy Keeler PA-C Entered By: Army Melia on 05/21/2019 09:50:37 Alexander Hogan ([id number]) -------------------------------------------------------------------------------- Problem List Details Patient Name: Alexander Hogan Date of Service: 05/21/2019 8:30 AM Medical Record Number: [id number] Patient Account Number: [id number] Date of Birth/Sex: Oct 17, 1922 (85 y.o. M) Treating RN: Army Melia Primary Care Provider: Dion Body Other Clinician: Referring Provider: Dion Body Treating Provider/Extender: Melburn Hake, Stace Peace Weeks in Treatment: 0 Active Problems ICD-10 Evaluated Encounter Code Description Active Date Today Diagnosis I87.2 Venous insufficiency (chronic) (peripheral) 05/21/2019 No Yes L97.822 Non-pressure chronic ulcer of other part of left lower leg with 05/21/2019 No Yes fat layer exposed [place] (primary) hypertension 05/21/2019 No Yes N18.3 Chronic kidney disease, stage 3 (moderate) 05/21/2019 No Yes C43.9 Malignant melanoma of skin, unspecified 05/21/2019 No Yes Inactive Problems Resolved Problems Electronic Signature(s) Signed: 05/22/2019 1:16:38 PM By: Worthy Keeler PA-C Entered By: Worthy Keeler on 05/21/2019 09:38:46 Alexander Hogan ([id number]) -------------------------------------------------------------------------------- Progress Note Details Patient Name: Alexander Hogan Date of Service: 05/21/2019 8:30 AM Medical Record Number: [id number] Patient Account Number: [id number] Date of Birth/Sex: 1922/11/09 (85 y.o. M) Treating RN: Army Melia Primary Care Provider: Dion Body Other Clinician: Referring Provider: Dion Body Treating Provider/Extender: Melburn Hake, Faizon Capozzi Weeks in Treatment: 0 Subjective Chief Complaint Information obtained from Patient Left LE Ulcer History of Present Illness (HPI) 05/21/19 on evaluation today patient actually appears to be doing poorly in regard to his left lower extremity in regard to a small area where there is some question of what exactly happened. This is the initial evaluation here in our clinic but apparently the  patient did have a surgical procedure to remove something that was questionable skin cancer. With that being said according to what the wife of the patient tell me this did not seem to end up being the case and he's just had a hard time getting this area to heal. Fortunately there's no signs of active infection at this time. He does have a history of metastatic melanoma of the right lower extremity he goes to [facility] and is being treated there on a regular basis or at least was until the Covid-19 Virus pandemic. He also has a history of hypertension and chronic kidney disease stage III. Patient History Information obtained from Patient. Allergies penicillin (Reaction: rash), hydrocodone Family History Cancer - Mother,Father, Heart Disease - Father, Hypertension - Father, Lung Disease - Father, No family history of Diabetes, Hereditary Spherocytosis, Kidney Disease, Stroke, Thyroid Problems, Tuberculosis. Social History Never smoker, Marital Status - Married, Alcohol Use - Never, Drug Use - No History, Caffeine Use - Never. Medical History Hematologic/Lymphatic Patient has history of Anemia Denies history of Hemophilia, Human Immunodeficiency Virus, Lymphedema, Sickle Cell  Disease Cardiovascular Patient has history of Coronary Artery Disease, Hypertension Denies history of Angina, Arrhythmia, Congestive Heart Failure, Deep Vein Thrombosis, Hypotension, Myocardial Infarction, Peripheral Arterial Disease, Peripheral Venous Disease, Phlebitis, Vasculitis Genitourinary Denies history of End Stage Renal Disease Integumentary (Skin) Denies history of History of Burn, History of pressure wounds Oncologic Patient has history of Received Chemotherapy, Received Radiation Medical And Surgical History Notes Cardiovascular HLD RONIEL, HALLORAN ([id number]) Genitourinary CKD stage 3 Oncologic Prostate cancer 1991 - chemo, radiation and surgery; 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 Eyes Denies complaints or symptoms of Dry Eyes, Vision Changes, Glasses / Contacts. Ear/Nose/Mouth/Throat Denies complaints or symptoms of Difficult clearing ears, Sinusitis. Hematologic/Lymphatic Denies complaints or symptoms of Bleeding / Clotting Disorders, Human Immunodeficiency Virus. Respiratory Denies complaints or symptoms of Chronic or frequent coughs, Shortness of Breath. Cardiovascular Complains or has symptoms of LE edema. Denies complaints or symptoms of Chest pain. Gastrointestinal Denies complaints or symptoms of Frequent diarrhea, Nausea, Vomiting. Endocrine Denies complaints or symptoms of Hepatitis, Thyroid disease, Polydypsia (Excessive Thirst). Genitourinary Complains or has symptoms of Kidney failure/ Dialysis - CKD stage 3. Denies complaints or symptoms of Incontinence/dribbling. Immunological Denies complaints or symptoms of Hives, Itching. Integumentary (Skin) Complains or has symptoms of Wounds, Swelling. Denies complaints or symptoms of Bleeding or bruising tendency,  Breakdown. Neurologic Denies complaints or symptoms of Numbness/parasthesias, Focal/Weakness. Psychiatric Denies complaints or symptoms of Anxiety, Claustrophobia. Objective Constitutional sitting or standing blood pressure is within target range for patient.. pulse regular and within target range for patient.Marland Kitchen respirations regular, non-labored and within target range for patient.Marland Kitchen temperature within target range for patient.. Well- nourished and well-hydrated in no acute distress. Vitals Time Taken: 8:59 AM, Height: 70 in, Source: Measured, Weight: 198 lbs, Source: Measured, BMI: 28.4, Temperature: 98.7 F, Pulse: 43 bpm, Respiratory Rate: 18 breaths/min, Blood Pressure: 122/48 mmHg. Eyes conjunctiva clear no eyelid edema noted. pupils equal round and reactive to light and accommodation. JEX, STRAUSBAUGH S. ([id number]) Ears, Nose, Mouth, and Throat no gross abnormality of ear auricles or external auditory canals. normal hearing noted during conversation. mucus membranes moist. Respiratory normal breathing without difficulty. clear to auscultation bilaterally. Cardiovascular regular rate and rhythm with normal S1, S2. 1+ pitting edema of the bilateral lower extremities. Gastrointestinal (GI) soft,  non-tender, non-distended, +BS. no ventral hernia noted. Musculoskeletal Patient unable to walk without assistance. no significant deformity or arthritic changes, no loss or range of motion, no clubbing. Psychiatric this patient is able to make decisions and demonstrates good insight into disease process. Alert and Oriented x 3. pleasant and cooperative. General Notes: Upon inspection today patient's wound bed actually did not appear to be too bad he did have some healing although there was some extremely inherent Slough noted in the central portion of the wound as well which was somewhat tender to touch but nothing too significant. There did not appear to be any signs of infection. No  fevers, chills, nausea, or vomiting noted at this time. It also does not appear that the wound goes to deeply although I do think using something on the wound to try to help soften this up would definitely be beneficial. The patient is in agreement with this plan. The short reports performed as I'm not sure that again with this history I want to do this until I can get a result from the biopsy that shows that this indeed was negative for any malignancy. Nonetheless it seems to be healing well I'm hopeful that is not the case. Integumentary (Hair, Skin) Wound #1 status is Open. Original cause of wound was Surgical Injury. The wound is located on the Left,Medial Lower Leg. The wound measures 0.6cm length x 0.9cm width x 0.1cm depth; 0.424cm^2 area and 0.042cm^3 volume. There is Fat Layer (Subcutaneous Tissue) Exposed exposed. There is no tunneling or undermining noted. There is a medium amount of serous drainage noted. The wound margin is flat and intact. There is medium (34-66%) red granulation within the wound bed. There is a medium (34-66%) amount of necrotic tissue within the wound bed including Adherent Slough. Assessment Active Problems ICD-10 Venous insufficiency (chronic) (peripheral) Non-pressure chronic ulcer of other part of left lower leg with fat layer exposed Essential (primary) hypertension Chronic kidney disease, stage 3 (moderate) Malignant melanoma of skin, unspecified Procedures ARLOW, SPIERS ([id number]) Wound #1 Pre-procedure diagnosis of Wound #1 is a Venous Leg Ulcer located on the Left,Medial Lower Leg . There was a Three Layer Compression Therapy Procedure with a pre-treatment ABI of 1 by Army Melia, RN. Post procedure Diagnosis Wound #1: Same as Pre-Procedure Plan Wound Cleansing: Wound #1 Left,Medial Lower Leg: Clean wound with Normal Saline. Primary Wound Dressing: Wound #1 Left,Medial Lower Leg: Iodoflex Secondary Dressing: Wound #1 Left,Medial Lower  Leg: ABD pad Dressing Change Frequency: Wound #1 Left,Medial Lower Leg: Dressing is to be changed Monday and Thursday. Follow-up Appointments: Wound #1 Left,Medial Lower Leg: Return Appointment in 1 week. Nurse Visit as needed - Monday 6/22 Edema Control: 3 Layer Compression System - Left Lower Extremity At this point my suggestion was that we go ahead and initiate treatment with Iodoflex I think if we keep this on here and keep the area covered with bandaging as described above will be able to help out with getting this to clean up nicely so that it will kill appropriately. The patient is in agreement Plan. We will subsequently see were things stand at follow-up. If anything changes or worsens in the meantime he will contact the office and let me know. Please see above for specific wound care orders. We will see patient for re-evaluation in 1 week(s) here in the clinic. If anything worsens or changes patient will contact our office for additional recommendations. Electronic Signature(s) Signed: 05/22/2019 1:16:38 PM By: Worthy Keeler PA-C Entered  By: Worthy Keeler on 05/22/2019 10:59:44 Alexander Hogan ([id number]) -------------------------------------------------------------------------------- ROS/PFSH Details Patient Name: Alexander Hogan Date of Service: 05/21/2019 8:30 AM Medical Record Number: [id number] Patient Account Number: [id number] Date of Birth/Sex: 07-05-1922 (85 y.o. M) Treating RN: Montey Hora Primary Care Provider: Dion Body Other Clinician: Referring Provider: Dion Body Treating Provider/Extender: Melburn Hake, Benigna Delisi Weeks in Treatment: 0 Information Obtained From Patient Constitutional Symptoms (General Health) Complaints and Symptoms: Negative for: Fatigue; Fever; Chills; Marked Weight Change Eyes Complaints and Symptoms: Negative for: Dry Eyes; Vision Changes; Glasses / Contacts Ear/Nose/Mouth/Throat Complaints and  Symptoms: Negative for: Difficult clearing ears; Sinusitis Hematologic/Lymphatic Complaints and Symptoms: Negative for: Bleeding / Clotting Disorders; Human Immunodeficiency Virus Medical History: Positive for: Anemia Negative for: Hemophilia; Human Immunodeficiency Virus; Lymphedema; Sickle Cell Disease Respiratory Complaints and Symptoms: Negative for: Chronic or frequent coughs; Shortness of Breath Cardiovascular Complaints and Symptoms: Positive for: LE edema Negative for: Chest pain Medical History: Positive for: Coronary Artery Disease; Hypertension Negative for: Angina; Arrhythmia; Congestive Heart Failure; Deep Vein Thrombosis; Hypotension; Myocardial Infarction; Peripheral Arterial Disease; Peripheral Venous Disease; Phlebitis; Vasculitis Past Medical History Notes: HLD Gastrointestinal Complaints and Symptoms: Negative for: Frequent diarrhea; Nausea; Vomiting EMITT, MAGLIONE. ([id number]) Endocrine Complaints and Symptoms: Negative for: Hepatitis; Thyroid disease; Polydypsia (Excessive Thirst) Genitourinary Complaints and Symptoms: Positive for: Kidney failure/ Dialysis - CKD stage 3 Negative for: Incontinence/dribbling Medical History: Negative for: End Stage Renal Disease Past Medical History Notes: CKD stage 3 Immunological Complaints and Symptoms: Negative for: Hives; Itching Integumentary (Skin) Complaints and Symptoms: Positive for: Wounds; Swelling Negative for: Bleeding or bruising tendency; Breakdown Medical History: Negative for: History of Burn; History of pressure wounds Neurologic Complaints and Symptoms: Negative for: Numbness/parasthesias; Focal/Weakness Psychiatric Complaints and Symptoms: Negative for: Anxiety; Claustrophobia Oncologic Medical History: Positive for: Received Chemotherapy; Received Radiation Past Medical History Notes: Prostate cancer 1991 - chemo, radiation and surgery; lymphoma - groin - encapsulated with no  treatment; metastatic melamona - current Immunizations Pneumococcal Vaccine: Received Pneumococcal Vaccination: Yes Implantable Devices None Family and Social History Cancer: Yes - Mother,Father; Diabetes: No; Heart Disease: Yes - Father; Hereditary Spherocytosis: No; Hypertension: Yes - Father; Kidney Disease: No; Lung Disease: Yes - Father; Stroke: No; Thyroid Problems: No; Tuberculosis: No; Never smoker; Marital Status - Married; Alcohol Use: Never; Drug Use: No History; Caffeine Use: Never; Financial Concerns: No; Food, Clothing or Shelter Needs: No; Support System Lacking: No; Transportation Concerns: No JIHAD, BROWNLOW ([id number]) Electronic Signature(s) Signed: 05/21/2019 1:50:58 PM By: Montey Hora Signed: 05/22/2019 1:16:38 PM By: Worthy Keeler PA-C Entered By: Montey Hora on 05/21/2019 09:15:23 Alexander Hogan ([id number]) -------------------------------------------------------------------------------- SuperBill Details Patient Name: Alexander Hogan Date of Service: 05/21/2019 Medical Record Number: [id number] Patient Account Number: [id number] Date of Birth/Sex: [birth year] (85 y.o. M) Treating RN: Army Melia Primary Care Provider: Dion Body Other Clinician: Referring Provider: Dion Body Treating Provider/Extender: Melburn Hake, Xavious Sharrar Weeks in Treatment: 0 Diagnosis Coding ICD-10 Codes Code Description I87.2 Venous insufficiency (chronic) (peripheral) L97.822 Non-pressure chronic ulcer of other part of left lower leg with fat layer exposed I10 Essential (primary) hypertension N18.3 Chronic kidney disease, stage 3 (moderate) C43.9 Malignant melanoma of skin, unspecified Facility Procedures CPT4 Code: [id number] Description: 99213 - WOUND CARE VISIT-LEV 3 EST PT Modifier: Quantity: 1 Physician Procedures CPT4 Code Description: [id number] WC PHYS LEVEL 3 o NEW PT ICD-10 Diagnosis Description I87.2 Venous insufficiency (chronic) (peripheral)  L97.822 Non-pressure chronic ulcer of other part of left lower leg wi I10 Essential (primary) hypertension N18.3  Chronic  kidney disease, stage 3 (moderate) Modifier: th fat layer expos Quantity: 1 ed Electronic Signature(s) Signed: 05/22/2019 1:16:38 PM By: Worthy Keeler PA-C Entered By: Worthy Keeler on 05/21/2019 23:49:16

## 2019-05-25 ENCOUNTER — Other Ambulatory Visit: Payer: Self-pay

## 2019-05-25 DIAGNOSIS — L97822 Non-pressure chronic ulcer of other part of left lower leg with fat layer exposed: Secondary | ICD-10-CM | POA: Diagnosis not present

## 2019-05-26 NOTE — Progress Notes (Signed)
BRAXLEY, BALANDRAN ([id number]) Visit Report for 05/25/2019 Arrival Information Details Patient Name: ALLYN, BERTONI Date of Service: 05/25/2019 9:30 AM Medical Record Number: [id number] Patient Account Number: [id number] Date of Birth/Sex: 1922/10/08 (85 y.o. M) Treating RN: Cornell Barman Primary Care Trudi Morgenthaler: Dion Body Other Clinician: Referring Bosten Newstrom: Dion Body Treating Tramane Gorum/Extender: Melburn Hake, HOYT Weeks in Treatment: 0 Visit Information History Since Last Visit Added or deleted any medications: No Patient Arrived: Wheel Chair Any new allergies or adverse reactions: No Arrival Time: 09:43 Had a fall or experienced change in No Accompanied By: wife activities of daily living that may affect Transfer Assistance: Manual risk of falls: Patient Identification Verified: Yes Signs or symptoms of abuse/neglect since last visito No Secondary Verification Process Completed: Yes Hospitalized since last visit: No Implantable device outside of the clinic excluding No cellular tissue based products placed in the center since last visit: Pain Present Now: No Electronic Signature(s) Signed: 05/25/2019 4:23:08 PM By: Gretta Cool, BSN, RN, CWS, Kim RN, BSN Entered By: Gretta Cool, BSN, RN, CWS, Kim on 05/25/2019 09:44:01 [name] ([id number]) -------------------------------------------------------------------------------- Compression Therapy Details Patient Name: [name] Date of Service: 05/25/2019 9:30 AM Medical Record Number: [id number] Patient Account Number: [id number] Date of Birth/Sex: Jan 10, 1922 (85 y.o. M) Treating RN: Cornell Barman Primary Care Lachae Hohler: Dion Body Other Clinician: Referring Skylar Priest: Dion Body Treating Nareg Breighner/Extender: Melburn Hake, HOYT Weeks in Treatment: 0 Compression Therapy Performed for Wound Assessment: Wound #1 Left,Medial Lower Leg Performed By: Clinician Cornell Barman, RN Compression Type: Three Layer Pre  Treatment ABI: 1 Electronic Signature(s) Signed: 05/25/2019 4:23:08 PM By: Gretta Cool, BSN, RN, CWS, Kim RN, BSN Entered By: Gretta Cool, BSN, RN, CWS, Kim on 05/25/2019 09:46:12 [name] ([id number]) -------------------------------------------------------------------------------- Encounter Discharge Information Details Patient Name: [name] Date of Service: 05/25/2019 9:30 AM Medical Record Number: [id number] Patient Account Number: [id number] Date of Birth/Sex: May 11, 1922 (85 y.o. M) Treating RN: Cornell Barman Primary Care Charitie Hinote: Dion Body Other Clinician: Referring Timoteo Carreiro: Dion Body Treating Keagan Brislin/Extender: Melburn Hake, HOYT Weeks in Treatment: 0 Encounter Discharge Information Items Discharge Condition: Stable Ambulatory Status: Wheelchair Discharge Destination: Home Transportation: Private Auto Accompanied By: wife Schedule Follow-up Appointment: Yes Clinical Summary of Care: Electronic Signature(s) Signed: 05/25/2019 4:23:08 PM By: Gretta Cool, BSN, RN, CWS, Kim RN, BSN Entered By: Gretta Cool, BSN, RN, CWS, Kim on 05/25/2019 09:56:31 [name] ([id number]) -------------------------------------------------------------------------------- Wound Assessment Details Patient Name: [name] Date of Service: 05/25/2019 9:30 AM Medical Record Number: [id number] Patient Account Number: [id number] Date of Birth/Sex: [birth year] (85 y.o. M) Treating RN: Cornell Barman Primary Care Timera Windt: Dion Body Other Clinician: Referring Glynn Yepes: Dion Body Treating Lidya Mccalister/Extender: Melburn Hake, HOYT Weeks in Treatment: 0 Wound Status Wound Number: 1 Primary Venous Leg Ulcer Etiology: Wound Location: Left Lower Leg - Medial Wound Open Wounding Event: Surgical Injury Status: Date Acquired: 02/01/2019 Comorbid Anemia, Coronary Artery Disease, Weeks Of Treatment: 0 History: Hypertension, Received Chemotherapy, Clustered Wound: No Received  Radiation Photos Wound Measurements Length: (cm) 0.7 Width: (cm) 0.8 Depth: (cm) 0.2 Area: (cm) 0.44 Volume: (cm) 0.088 % Reduction in Area: -3.8% % Reduction in Volume: -109.5% Epithelialization: None Tunneling: No Undermining: No Wound Description Full Thickness Without Exposed Support Foul Odo Classification: Structures Slough/F Wound Margin: Flat and Intact Exudate Medium Amount: Exudate Type: Serous Exudate Color: amber r After Cleansing: No ibrino Yes Wound Bed Granulation Amount: Medium (34-66%) Exposed Structure Granulation Quality: Red Fascia Exposed: No Necrotic Amount: Medium (34-66%) Fat Layer (Subcutaneous Tissue) Exposed: Yes Necrotic Quality: Adherent Slough Tendon Exposed: No  Muscle Exposed: No Joint Exposed: No Bone Exposed: No TYGE, SOMERS ([id number]) Treatment Notes Wound #1 (Left, Medial Lower Leg) Notes iodoflex, ABD, 3 layer Electronic Signature(s) Signed: 05/25/2019 4:23:08 PM By: Gretta Cool, BSN, RN, CWS, Kim RN, BSN Entered By: Gretta Cool, BSN, RN, CWS, Kim on 05/25/2019 09:45:33

## 2019-05-28 ENCOUNTER — Other Ambulatory Visit: Payer: Self-pay

## 2019-05-28 ENCOUNTER — Encounter: Payer: Medicare Other | Admitting: Physician Assistant

## 2019-05-28 DIAGNOSIS — L97822 Non-pressure chronic ulcer of other part of left lower leg with fat layer exposed: Secondary | ICD-10-CM | POA: Diagnosis not present

## 2019-05-31 NOTE — Progress Notes (Signed)
AMOND, SPERANZA ([id number]) Visit Report for 05/28/2019 Arrival Information Details Patient Name: Alexander Hogan, Alexander Hogan Date of Service: 05/28/2019 1:45 PM Medical Record Number: [id number] Patient Account Number: [id number] Date of Birth/Sex: 01-25-1922 (85 y.o. M) Treating RN: Montey Hora Primary Care Verba Ainley: Dion Body Other Clinician: Referring Camber Ninh: Dion Body Treating Lonney Revak/Extender: Melburn Hake, HOYT Weeks in Treatment: 1 Visit Information History Since Last Visit Added or deleted any medications: No Patient Arrived: Wheel Chair Any new allergies or adverse reactions: No Arrival Time: 14:15 Had a fall or experienced change in No Accompanied By: wife activities of daily living that may affect Transfer Assistance: None risk of falls: Patient Identification Verified: Yes Signs or symptoms of abuse/neglect since last visito No Secondary Verification Process Completed: Yes Hospitalized since last visit: No Implantable device outside of the clinic excluding No cellular tissue based products placed in the center since last visit: Has Dressing in Place as Prescribed: Yes Has Compression in Place as Prescribed: Yes Pain Present Now: No Electronic Signature(s) Signed: 05/28/2019 5:10:09 PM By: Montey Hora Entered By: Montey Hora on 05/28/2019 14:16:09 Alexander Hogan ([id number]) -------------------------------------------------------------------------------- Compression Therapy Details Patient Name: Alexander Hogan Date of Service: 05/28/2019 1:45 PM Medical Record Number: [id number] Patient Account Number: [id number] Date of Birth/Sex: Mar 26, 1922 (85 y.o. M) Treating RN: Army Melia Primary Care Revonda Menter: Dion Body Other Clinician: Referring Priyansh Pry: Dion Body Treating Jerlean Peralta/Extender: Melburn Hake, HOYT Weeks in Treatment: 1 Compression Therapy Performed for Wound Assessment: Wound #1 Left,Medial Lower Leg Performed By:  Clinician Army Melia, RN Compression Type: Three Layer Pre Treatment ABI: 1 Post Procedure Diagnosis Same as Pre-procedure Electronic Signature(s) Signed: 05/29/2019 12:02:04 PM By: Sharon Mt Previous Signature: 05/28/2019 4:27:33 PM Version By: Army Melia Entered By: Sharon Mt on 05/29/2019 12:02:03 Alexander Hogan ([id number]) -------------------------------------------------------------------------------- Encounter Discharge Information Details Patient Name: Alexander Hogan Date of Service: 05/28/2019 1:45 PM Medical Record Number: [id number] Patient Account Number: [id number] Date of Birth/Sex: 11/19/1922 (85 y.o. M) Treating RN: Army Melia Primary Care Tameron Lama: Dion Body Other Clinician: Referring Chanc Kervin: Dion Body Treating Tamya Denardo/Extender: Melburn Hake, HOYT Weeks in Treatment: 1 Encounter Discharge Information Items Discharge Condition: Stable Ambulatory Status: Ambulatory Discharge Destination: Home Transportation: Private Auto Accompanied By: self Schedule Follow-up Appointment: Yes Clinical Summary of Care: Electronic Signature(s) Signed: 05/28/2019 4:27:33 PM By: Army Melia Entered By: Army Melia on 05/28/2019 14:56:33 Alexander Hogan ([id number]) -------------------------------------------------------------------------------- Lower Extremity Assessment Details Patient Name: Alexander Hogan Date of Service: 05/28/2019 1:45 PM Medical Record Number: [id number] Patient Account Number: [id number] Date of Birth/Sex: 1922/06/07 (85 y.o. M) Treating RN: Montey Hora Primary Care Charlene Cowdrey: Dion Body Other Clinician: Referring Lavone Weisel: Dion Body Treating Farrell Pantaleo/Extender: Melburn Hake, HOYT Weeks in Treatment: 1 Edema Assessment Assessed: [Left: No] [Right: No] Edema: [Left: Ye] [Right: s] Calf Left: Right: Point of Measurement: 34 cm From Medial Instep 32.8 cm cm Ankle Left: Right: Point of Measurement: 13 cm  From Medial Instep 27.5 cm cm Vascular Assessment Pulses: Dorsalis Pedis Palpable: [Left:Yes] Electronic Signature(s) Signed: 05/28/2019 5:10:09 PM By: Montey Hora Entered By: Montey Hora on 05/28/2019 14:22:49 Alexander Hogan ([id number]) -------------------------------------------------------------------------------- Multi Wound Chart Details Patient Name: Alexander Hogan Date of Service: 05/28/2019 1:45 PM Medical Record Number: [id number] Patient Account Number: [id number] Date of Birth/Sex: 06/19/1922 (85 y.o. M) Treating RN: Army Melia Primary Care Herbert Marken: Dion Body Other Clinician: Referring Terrilyn Tyner: Dion Body Treating Kylah Maresh/Extender: Melburn Hake, HOYT Weeks in Treatment: 1 Vital Signs Height(in): 70 Pulse(bpm): 90 Weight(lbs): 198 Blood Pressure(mmHg): 123/56 Body Mass Index(BMI):  28 Temperature(F): 98.6 Respiratory Rate 18 (breaths/min): Photos: [N/A:N/A] Wound Location: Left Lower Leg - Medial N/A N/A Wounding Event: Surgical Injury N/A N/A Primary Etiology: Venous Leg Ulcer N/A N/A Comorbid History: Anemia, Coronary Artery N/A N/A Disease, Hypertension, Received Chemotherapy, Received Radiation Date Acquired: 02/01/2019 N/A N/A Weeks of Treatment: 1 N/A N/A Wound Status: Open N/A N/A Measurements L x W x D 0.7x0.8x0.2 N/A N/A (cm) Area (cm) : 0.44 N/A N/A Volume (cm) : 0.088 N/A N/A % Reduction in Area: -3.80% N/A N/A % Reduction in Volume: -109.50% N/A N/A Classification: Full Thickness Without N/A N/A Exposed Support Structures Exudate Amount: Medium N/A N/A Exudate Type: Serous N/A N/A Exudate Color: amber N/A N/A Wound Margin: Flat and Intact N/A N/A Granulation Amount: None Present (0%) N/A N/A Necrotic Amount: Large (67-100%) N/A N/A Exposed Structures: Fat Layer (Subcutaneous N/A N/A Tissue) Exposed: Yes Fascia: No Tendon: No Muscle: No Alexander Hogan, Alexander Hogan ([id number]) Joint: No Bone:  No Epithelialization: None N/A N/A Treatment Notes Electronic Signature(s) Signed: 05/28/2019 4:27:33 PM By: Army Melia Entered By: Army Melia on 05/28/2019 14:52:30 Alexander Hogan ([id number]) -------------------------------------------------------------------------------- [place] Details Patient Name: Alexander Hogan Date of Service: 05/28/2019 1:45 PM Medical Record Number: [id number] Patient Account Number: [id number] Date of Birth/Sex: 11-16-1922 (85 y.o. M) Treating RN: Army Melia Primary Care Tarry Blayney: Dion Body Other Clinician: Referring Matisha Termine: Dion Body Treating Audery Wassenaar/Extender: Melburn Hake, HOYT Weeks in Treatment: 1 Active Inactive Orientation to the Wound Care Program Nursing Diagnoses: Knowledge deficit related to the wound healing center program Goals: Patient/caregiver will verbalize understanding of the [facility] Date Initiated: 05/21/2019 Target Resolution Date: 05/21/2019 Goal Status: Active Interventions: Provide education on orientation to the wound center Notes: Wound/Skin Impairment Nursing Diagnoses: Impaired tissue integrity Goals: Ulcer/skin breakdown will have a volume reduction of 30% by week 4 Date Initiated: 05/21/2019 Target Resolution Date: 06/18/2019 Goal Status: Active Interventions: Assess patient/caregiver ability to obtain necessary supplies Assess patient/caregiver ability to perform ulcer/skin care regimen upon admission and as needed Notes: Electronic Signature(s) Signed: 05/28/2019 4:27:33 PM By: Army Melia Entered By: Army Melia on 05/28/2019 14:52:22 Alexander Hogan ([id number]) -------------------------------------------------------------------------------- Pain Assessment Details Patient Name: Alexander Hogan Date of Service: 05/28/2019 1:45 PM Medical Record Number: [id number] Patient Account Number: [id number] Date of Birth/Sex: 1922-08-05 (85 y.o.  M) Treating RN: Montey Hora Primary Care Mialani Reicks: Dion Body Other Clinician: Referring Destyni Hoppel: Dion Body Treating Tariq Pernell/Extender: Melburn Hake, HOYT Weeks in Treatment: 1 Active Problems Location of Pain Severity and Description of Pain Patient Has Paino No Site Locations Pain Management and Medication Current Pain Management: Electronic Signature(s) Signed: 05/28/2019 5:10:09 PM By: Montey Hora Entered By: Montey Hora on 05/28/2019 14:16:16 Alexander Hogan ([id number]) -------------------------------------------------------------------------------- Patient/Caregiver Education Details Patient Name: Alexander Hogan Date of Service: 05/28/2019 1:45 PM Medical Record Number: [id number] Patient Account Number: [id number] Date of Birth/Gender: Mar 08, 1922 (85 y.o. M) Treating RN: Army Melia Primary Care Physician: Dion Body Other Clinician: Referring Physician: Dion Body Treating Physician/Extender: Sharalyn Ink in Treatment: 1 Education Assessment Education Provided To: Patient Education Topics Provided Wound/Skin Impairment: Handouts: Caring for Your Ulcer Methods: Demonstration, Explain/Verbal Responses: State content correctly Electronic Signature(s) Signed: 05/28/2019 4:27:33 PM By: Army Melia Entered By: Army Melia on 05/28/2019 14:56:00 Alexander Hogan ([id number]) -------------------------------------------------------------------------------- Wound Assessment Details Patient Name: Alexander Hogan Date of Service: 05/28/2019 1:45 PM Medical Record Number: [id number] Patient Account Number: [id number] Date of Birth/Sex: 03/01/1922 (85 y.o. M) Treating RN: Montey Hora Primary Care Alechia Lezama:  Dion Body Other Clinician: Referring Tanvir Hipple: Dion Body Treating Shonita Rinck/Extender: Melburn Hake, HOYT Weeks in Treatment: 1 Wound Status Wound Number: 1 Primary Venous Leg Ulcer Etiology: Wound  Location: Left Lower Leg - Medial Wound Open Wounding Event: Surgical Injury Status: Date Acquired: 02/01/2019 Comorbid Anemia, Coronary Artery Disease, Weeks Of Treatment: 1 History: Hypertension, Received Chemotherapy, Clustered Wound: No Received Radiation Photos Wound Measurements Length: (cm) 0.7 Width: (cm) 0.8 Depth: (cm) 0.2 Area: (cm) 0.44 Volume: (cm) 0.088 % Reduction in Area: -3.8% % Reduction in Volume: -109.5% Epithelialization: None Tunneling: No Undermining: No Wound Description Full Thickness Without Exposed Support Foul Odo Classification: Structures Slough/F Wound Margin: Flat and Intact Exudate Medium Amount: Exudate Type: Serous Exudate Color: amber r After Cleansing: No ibrino Yes Wound Bed Granulation Amount: None Present (0%) Exposed Structure Necrotic Amount: Large (67-100%) Fascia Exposed: No Necrotic Quality: Adherent Slough Fat Layer (Subcutaneous Tissue) Exposed: Yes Tendon Exposed: No Muscle Exposed: No Joint Exposed: No Bone Exposed: No Alexander Hogan, Alexander Hogan ([id number]) Treatment Notes Wound #1 (Left, Medial Lower Leg) Notes prisma, ABD, 3 layer Electronic Signature(s) Signed: 05/28/2019 5:10:09 PM By: Montey Hora Entered By: Montey Hora on 05/28/2019 14:26:59 Alexander Hogan ([id number]) -------------------------------------------------------------------------------- [place] Details Patient Name: Alexander Hogan Date of Service: 05/28/2019 1:45 PM Medical Record Number: [id number] Patient Account Number: [id number] Date of Birth/Sex: 1922-08-08 (85 y.o. M) Treating RN: Montey Hora Primary Care Jasmain Ahlberg: Dion Body Other Clinician: Referring Lloyd Ayo: Dion Body Treating Ketura Sirek/Extender: Melburn Hake, HOYT Weeks in Treatment: 1 Vital Signs Time Taken: 14:17 Temperature (F): 98.6 Height (in): 70 Pulse (bpm): 90 Weight (lbs): 198 Respiratory Rate (breaths/min): 18 Body Mass Index (BMI):  28.4 Blood Pressure (mmHg): 123/56 Reference Range: 80 - 120 mg / dl Electronic Signature(s) Signed: 05/28/2019 5:10:09 PM By: Montey Hora Entered By: Montey Hora on 05/28/2019 14:18:15

## 2019-05-31 NOTE — Progress Notes (Signed)
Alexander Hogan, Alexander Hogan ([id number]) Visit Report for 05/28/2019 Chief Complaint Document Details Patient Name: Alexander Hogan, Alexander Hogan Date of Service: 05/28/2019 1:45 PM Medical Record Number: [id number] Patient Account Number: [id number] Date of Birth/Sex: February 14, 1922 (85 y.o. M) Treating RN: Army Melia Primary Care Provider: Dion Body Other Clinician: Referring Provider: Dion Body Treating Provider/Extender: Melburn Hake, HOYT Weeks in Treatment: 1 Information Obtained from: Patient Chief Complaint Left LE Ulcer Electronic Signature(s) Signed: 05/31/2019 11:25:56 PM By: Worthy Keeler PA-C Entered By: Worthy Keeler on 05/28/2019 14:50:49 Alexander Hogan ([id number]) -------------------------------------------------------------------------------- HPI Details Patient Name: Alexander Hogan Date of Service: 05/28/2019 1:45 PM Medical Record Number: [id number] Patient Account Number: [id number] Date of Birth/Sex: [birth year] (85 y.o. M) Treating RN: Army Melia Primary Care Provider: Dion Body Other Clinician: Referring Provider: Dion Body Treating Provider/Extender: Melburn Hake, HOYT Weeks in Treatment: 1 History of Present Illness HPI Description: 05/21/19 on evaluation today patient actually appears to be doing poorly in regard to his left lower extremity in regard to a small area where there is some question of what exactly happened. This is the initial evaluation here in our clinic but apparently the patient did have a surgical procedure to remove something that was questionable skin cancer. With that being said according to what the wife of the patient tell me this did not seem to end up being the case and he's just had a hard time getting this area to heal. Fortunately there's no signs of active infection at this time. He does have a history of metastatic melanoma of the right lower extremity he goes to [facility] and is being treated there on a regular basis  or at least was until the Covid-19 Virus pandemic. He also has a history of hypertension and chronic kidney disease stage III. 05/28/19 on evaluation today patient appears to be doing quite well in regard to the ulcer on his lower extremity. You select that the compression wrap has been beneficial for him this is excellent news. Subsequently I do think we may want to switch it dressing a little bit today just based on the appearance of the wound which is actually doing very well. Electronic Signature(s) Signed: 05/31/2019 11:25:56 PM By: Worthy Keeler PA-C Entered By: Worthy Keeler on 05/28/2019 23:45:38 Alexander Hogan ([id number]) -------------------------------------------------------------------------------- Physical Exam Details Patient Name: Alexander Hogan Date of Service: 05/28/2019 1:45 PM Medical Record Number: [id number] Patient Account Number: [id number] Date of Birth/Sex: Feb 13, 1922 (85 y.o. M) Treating RN: Army Melia Primary Care Provider: Dion Body Other Clinician: Referring Provider: Dion Body Treating Provider/Extender: Melburn Hake, HOYT Weeks in Treatment: 1 Constitutional Well-nourished and well-hydrated in no acute distress. Respiratory normal breathing without difficulty. clear to auscultation bilaterally. Cardiovascular regular rate and rhythm with normal S1, S2. Psychiatric this patient is able to make decisions and demonstrates good insight into disease process. Alert and Oriented x 3. pleasant and cooperative. Notes Patient's wound bed currently showed signs of good granulation at this time. Fortunately there is no evidence of active infection and no Slough buildup. I think we can switch from the Iodoflex to [facility] today. Electronic Signature(s) Signed: 05/31/2019 11:25:56 PM By: Worthy Keeler PA-C Entered By: Worthy Keeler on 05/28/2019 23:46:20 Alexander Hogan  ([id number]) -------------------------------------------------------------------------------- Physician Orders Details Patient Name: Alexander Hogan Date of Service: 05/28/2019 1:45 PM Medical Record Number: [id number] Patient Account Number: [id number] Date of Birth/Sex: 05-03-1922 (85 y.o. M) Treating RN: Army Melia Primary Care Provider: Dion Body Other Clinician: Referring Provider:  Dion Body Treating Provider/Extender: Melburn Hake, HOYT Weeks in Treatment: 1 Verbal / Phone Orders: No Diagnosis Coding ICD-10 Coding Code Description I87.2 Venous insufficiency (chronic) (peripheral) L97.822 Non-pressure chronic ulcer of other part of left lower leg with fat layer exposed I10 Essential (primary) hypertension N18.3 Chronic kidney disease, stage 3 (moderate) C43.9 Malignant melanoma of skin, unspecified Wound Cleansing Wound #1 Left,Medial Lower Leg o Clean wound with Normal Saline. Primary Wound Dressing Wound #1 Left,Medial Lower Leg o Silver Collagen Secondary Dressing Wound #1 Left,Medial Lower Leg o ABD pad Dressing Change Frequency Wound #1 Left,Medial Lower Leg o Dressing is to be changed Monday and Thursday. Follow-up Appointments Wound #1 Left,Medial Lower Leg o Return Appointment in 1 week. o Nurse Visit as needed - Thursday 7/2 Edema Control o 3 Layer Compression System - Left Lower Extremity Electronic Signature(s) Signed: 05/28/2019 4:27:33 PM By: Army Melia Signed: 05/31/2019 11:25:56 PM By: Worthy Keeler PA-C Entered By: Army Melia on 05/28/2019 14:55:07 Alexander Hogan ([id number]) -------------------------------------------------------------------------------- Problem List Details Patient Name: Alexander Hogan Date of Service: 05/28/2019 1:45 PM Medical Record Number: [id number] Patient Account Number: [id number] Date of Birth/Sex: 11/03/1922 (85 y.o. M) Treating RN: Army Melia Primary Care Provider: Dion Body Other Clinician: Referring Provider: Dion Body Treating Provider/Extender: Melburn Hake, HOYT Weeks in Treatment: 1 Active Problems ICD-10 Evaluated Encounter Code Description Active Date Today Diagnosis I87.2 Venous insufficiency (chronic) (peripheral) 05/21/2019 No Yes L97.822 Non-pressure chronic ulcer of other part of left lower leg with 05/21/2019 No Yes fat layer exposed [place] (primary) hypertension 05/21/2019 No Yes N18.3 Chronic kidney disease, stage 3 (moderate) 05/21/2019 No Yes C43.9 Malignant melanoma of skin, unspecified 05/21/2019 No Yes Inactive Problems Resolved Problems Electronic Signature(s) Signed: 05/31/2019 11:25:56 PM By: Worthy Keeler PA-C Entered By: Worthy Keeler on 05/28/2019 14:50:44 Alexander Hogan ([id number]) -------------------------------------------------------------------------------- Progress Note Details Patient Name: Alexander Hogan Date of Service: 05/28/2019 1:45 PM Medical Record Number: [id number] Patient Account Number: [id number] Date of Birth/Sex: [birth year] (85 y.o. M) Treating RN: Army Melia Primary Care Provider: Dion Body Other Clinician: Referring Provider: Dion Body Treating Provider/Extender: Melburn Hake, HOYT Weeks in Treatment: 1 Subjective Chief Complaint Information obtained from Patient Left LE Ulcer History of Present Illness (HPI) 05/21/19 on evaluation today patient actually appears to be doing poorly in regard to his left lower extremity in regard to a small area where there is some question of what exactly happened. This is the initial evaluation here in our clinic but apparently the patient did have a surgical procedure to remove something that was questionable skin cancer. With that being said according to what the wife of the patient tell me this did not seem to end up being the case and he's just had a hard time getting this area to heal. Fortunately there's no signs of  active infection at this time. He does have a history of metastatic melanoma of the right lower extremity he goes to [facility] and is being treated there on a regular basis or at least was until the Covid-19 Virus pandemic. He also has a history of hypertension and chronic kidney disease stage III. 05/28/19 on evaluation today patient appears to be doing quite well in regard to the ulcer on his lower extremity. You select that the compression wrap has been beneficial for him this is excellent news. Subsequently I do think we may want to switch it dressing a little bit today just based on the appearance of the wound which is actually  doing very well. Patient History Information obtained from Patient. Family History Cancer - Mother,Father, Heart Disease - Father, Hypertension - Father, Lung Disease - Father, No family history of Diabetes, Hereditary Spherocytosis, Kidney Disease, Stroke, Thyroid Problems, Tuberculosis. Social History Never smoker, Marital Status - Married, Alcohol Use - Never, Drug Use - No History, Caffeine Use - Never. Medical History Hematologic/Lymphatic Patient has history of Anemia Denies history of Hemophilia, Human Immunodeficiency Virus, Lymphedema, Sickle Cell Disease Cardiovascular Patient has history of Coronary Artery Disease, Hypertension Denies history of Angina, Arrhythmia, Congestive Heart Failure, Deep Vein Thrombosis, Hypotension, Myocardial Infarction, Peripheral Arterial Disease, Peripheral Venous Disease, Phlebitis, Vasculitis Genitourinary Denies history of End Stage Renal Disease Integumentary (Skin) Denies history of History of Burn, History of pressure wounds Oncologic Patient has history of Received Chemotherapy, Received Radiation Medical And Surgical History Notes Cardiovascular HLD Alexander Hogan, Alexander Hogan ([id number]) Genitourinary CKD stage 3 Oncologic Prostate cancer 1991 - chemo, radiation and surgery; 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 Respiratory Denies complaints or symptoms of Chronic or frequent coughs, Shortness of Breath. Cardiovascular Denies complaints or symptoms of Chest pain, LE edema. Psychiatric Denies complaints or symptoms of Anxiety, Claustrophobia. Objective Constitutional Well-nourished and well-hydrated in no acute distress. Vitals Time Taken: 2:17 PM, Height: 70 in, Weight: 198 lbs, BMI: 28.4, Temperature: 98.6 F, Pulse: 90 bpm, Respiratory Rate: 18 breaths/min, Blood Pressure: 123/56 mmHg. Respiratory normal breathing without difficulty. clear to auscultation bilaterally. Cardiovascular regular rate and rhythm with normal S1, S2. Psychiatric this patient is able to make decisions and demonstrates good insight into disease process. Alert and Oriented x 3. pleasant and cooperative. General Notes: Patient's wound bed currently showed signs of good granulation at this time. Fortunately there is no evidence of active infection and no Slough buildup. I think we can switch from the Iodoflex to [facility] today. Integumentary (Hair, Skin) Wound #1 status is Open. Original cause of wound was Surgical Injury. The wound is located on the Left,Medial Lower Leg. The wound measures 0.7cm length x 0.8cm width x 0.2cm depth; 0.44cm^2 area and 0.088cm^3 volume. There is Fat Layer (Subcutaneous Tissue) Exposed exposed. There is no tunneling or undermining noted. There is a medium amount of serous drainage noted. The wound margin is flat and intact. There is no granulation within the wound bed. There is a large (67-100%) amount of necrotic tissue within the wound bed including Adherent Slough. Alexander Hogan, Alexander Hogan ([id number]) Assessment Active Problems ICD-10 Venous insufficiency (chronic) (peripheral) Non-pressure chronic ulcer of  other part of left lower leg with fat layer exposed Essential (primary) hypertension Chronic kidney disease, stage 3 (moderate) Malignant melanoma of skin, unspecified Procedures Wound #1 Pre-procedure diagnosis of Wound #1 is a Venous Leg Ulcer located on the Left,Medial Lower Leg . There was a Three Layer Compression Therapy Procedure with a pre-treatment ABI of 1 by Army Melia, RN. Post procedure Diagnosis Wound #1: Same as Pre-Procedure Plan Wound Cleansing: Wound #1 Left,Medial Lower Leg: Clean wound with Normal Saline. Primary Wound Dressing: Wound #1 Left,Medial Lower Leg: Silver Collagen Secondary Dressing: Wound #1 Left,Medial Lower Leg: ABD pad Dressing Change Frequency: Wound #1 Left,Medial Lower Leg: Dressing is to be changed Monday and Thursday. Follow-up Appointments: Wound #1 Left,Medial Lower Leg: Return Appointment in 1 week. Nurse Visit as needed - Thursday 7/2 Edema Control: 3 Layer Compression System - Left Lower Extremity My suggestion  is that we continue to wrap a slate with a three layer compression wrap that we do need to switch I think the Prisma at this will likely help this to heal more quickly and that's what we're after. He and his wife are in agreement with plan. Will subsequently see were things that at follow-up. Please see above for specific wound care orders. We will see patient for re-evaluation in 1 week(s) here in the clinic. If anything worsens or changes patient will contact our office for additional recommendations. Alexander Hogan, Alexander Hogan ([id number]) Electronic Signature(s) Signed: 05/31/2019 11:25:56 PM By: Worthy Keeler PA-C Entered By: Worthy Keeler on 05/28/2019 23:46:31 Alexander Hogan ([id number]) -------------------------------------------------------------------------------- ROS/PFSH Details Patient Name: Alexander Hogan Date of Service: 05/28/2019 1:45 PM Medical Record Number: [id number] Patient Account Number:  [id number] Date of Birth/Sex: 1922-09-29 (85 y.o. M) Treating RN: Army Melia Primary Care Provider: Dion Body Other Clinician: Referring Provider: Dion Body Treating Provider/Extender: Melburn Hake, HOYT Weeks in Treatment: 1 Information Obtained From Patient Constitutional Symptoms (General Health) Complaints and Symptoms: Negative for: Fatigue; Fever; Chills; Marked Weight Change Respiratory Complaints and Symptoms: Negative for: Chronic or frequent coughs; Shortness of Breath Cardiovascular Complaints and Symptoms: Negative for: Chest pain; LE edema Medical History: Positive for: Coronary Artery Disease; Hypertension Negative for: Angina; Arrhythmia; Congestive Heart Failure; Deep Vein Thrombosis; Hypotension; Myocardial Infarction; Peripheral Arterial Disease; Peripheral Venous Disease; Phlebitis; Vasculitis Past Medical History Notes: HLD Psychiatric Complaints and Symptoms: Negative for: Anxiety; Claustrophobia Hematologic/Lymphatic Medical History: Positive for: Anemia Negative for: Hemophilia; Human Immunodeficiency Virus; Lymphedema; Sickle Cell Disease Genitourinary Medical History: Negative for: End Stage Renal Disease Past Medical History Notes: CKD stage 3 Integumentary (Skin) Medical History: Negative for: History of Burn; History of pressure wounds Oncologic Alexander Hogan, Alexander Hogan ([id number]) Medical History: Positive for: Received Chemotherapy; Received Radiation Past Medical History Notes: Prostate cancer 1991 - chemo, radiation and surgery; 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; metastatic melamona - current Immunizations Pneumococcal Vaccine: Received Pneumococcal Vaccination: Yes Implantable Devices None Family and Social History Cancer: Yes - Mother,Father; Diabetes: No; Heart Disease: Yes - Father; Hereditary Spherocytosis: No; Hypertension: Yes - Father; Kidney Disease: No; Lung Disease: Yes - Father; Stroke: No;  Thyroid Problems: No; Tuberculosis: No; Never smoker; Marital Status - Married; Alcohol Use: Never; Drug Use: No History; Caffeine Use: Never; Financial Concerns: No; Food, Clothing or Shelter Needs: No; Support System Lacking: No; Transportation Concerns: No Physician Affirmation I have reviewed and agree with the above information. Electronic Signature(s) Signed: 05/29/2019 3:22:52 PM By: Army Melia Signed: 05/31/2019 11:25:56 PM By: Worthy Keeler PA-C Entered By: Worthy Keeler on 05/28/2019 23:45:55 Alexander Hogan ([id number]) -------------------------------------------------------------------------------- SuperBill Details Patient Name: Alexander Hogan Date of Service: 05/28/2019 Medical Record Number: [id number] Patient Account Number: [id number] Date of Birth/Sex: 06-Jul-1922 (85 y.o. M) Treating RN: Army Melia Primary Care Provider: Dion Body Other Clinician: Referring Provider: Dion Body Treating Provider/Extender: Melburn Hake, HOYT Weeks in Treatment: 1 Diagnosis Coding ICD-10 Codes Code Description I87.2 Venous insufficiency (chronic) (peripheral) L97.822 Non-pressure chronic ulcer of other part of left lower leg with fat layer exposed I10 Essential (primary) hypertension N18.3 Chronic kidney disease, stage 3 (moderate) C43.9 Malignant melanoma of skin, unspecified Facility Procedures CPT4 Code: [id number] Description: (Facility Use Only) [phone] - [place] LWR LT LEG Modifier: Quantity: 1 Physician Procedures CPT4 Code Description: [id number] - WC PHYS LEVEL 4 - EST PT ICD-10 Diagnosis Description L97.822 Non-pressure chronic ulcer of other part of left lower  leg wit I87.2 Venous insufficiency (chronic) (peripheral) I10 Essential (primary) hypertension  N18.3 Chronic kidney disease, stage 3 (moderate) Modifier: h fat layer expos Quantity: 1 ed Electronic Signature(s) Signed: 05/29/2019 12:02:44 PM By: Sharon Mt Signed: 05/31/2019  11:25:56 PM By: Worthy Keeler PA-C Entered By: Sharon Mt on 05/29/2019 12:02:43

## 2019-06-04 ENCOUNTER — Other Ambulatory Visit: Payer: Self-pay

## 2019-06-04 ENCOUNTER — Encounter: Payer: Medicare Other | Attending: Physician Assistant

## 2019-06-04 DIAGNOSIS — Z9221 Personal history of antineoplastic chemotherapy: Secondary | ICD-10-CM | POA: Diagnosis not present

## 2019-06-04 DIAGNOSIS — Z85820 Personal history of malignant melanoma of skin: Secondary | ICD-10-CM | POA: Insufficient documentation

## 2019-06-04 DIAGNOSIS — I872 Venous insufficiency (chronic) (peripheral): Secondary | ICD-10-CM | POA: Diagnosis not present

## 2019-06-04 DIAGNOSIS — Z923 Personal history of irradiation: Secondary | ICD-10-CM | POA: Insufficient documentation

## 2019-06-04 DIAGNOSIS — L97822 Non-pressure chronic ulcer of other part of left lower leg with fat layer exposed: Secondary | ICD-10-CM | POA: Insufficient documentation

## 2019-06-04 DIAGNOSIS — I129 Hypertensive chronic kidney disease with stage 1 through stage 4 chronic kidney disease, or unspecified chronic kidney disease: Secondary | ICD-10-CM | POA: Diagnosis not present

## 2019-06-04 DIAGNOSIS — N183 Chronic kidney disease, stage 3 (moderate): Secondary | ICD-10-CM | POA: Diagnosis not present

## 2019-06-04 DIAGNOSIS — Z8572 Personal history of non-Hodgkin lymphomas: Secondary | ICD-10-CM | POA: Diagnosis not present

## 2019-06-04 DIAGNOSIS — Z8546 Personal history of malignant neoplasm of prostate: Secondary | ICD-10-CM | POA: Insufficient documentation

## 2019-06-04 DIAGNOSIS — I2510 Atherosclerotic heart disease of native coronary artery without angina pectoris: Secondary | ICD-10-CM | POA: Insufficient documentation

## 2019-06-04 NOTE — Progress Notes (Signed)
Alexander Hogan ([id number]) Visit Report for 06/04/2019 Arrival Information Details Patient Name: Alexander Hogan, Alexander Hogan Date of Service: 06/04/2019 9:30 AM Medical Record Number: [id number] Patient Account Number: [id number] Date of Birth/Sex: 1922/06/09 (85 y.o. M) Treating RN: Harold Barban Primary Care Baird Polinski: Dion Body Other Clinician: Referring Cherilyn Sautter: Dion Body Treating Jahon Bart/Extender: Melburn Hake, HOYT Weeks in Treatment: 2 Visit Information History Since Last Visit Added or deleted any medications: No Patient Arrived: Wheel Chair Any new allergies or adverse reactions: No Arrival Time: 09:33 Had a fall or experienced change in No Accompanied By: wife activities of daily living that may affect Transfer Assistance: None risk of falls: Patient Identification Verified: Yes Signs or symptoms of abuse/neglect since last visito No Secondary Verification Process Completed: Yes Hospitalized since last visit: No Has Dressing in Place as Prescribed: Yes Has Compression in Place as Prescribed: Yes Pain Present Now: No Electronic Signature(s) Signed: 06/04/2019 9:59:53 AM By: Harold Barban Entered By: Harold Barban on 06/04/2019 09:50:38 Alexander Hogan ([id number]) -------------------------------------------------------------------------------- Clinic Level of Care Assessment Details Patient Name: Alexander Hogan Date of Service: 06/04/2019 9:30 AM Medical Record Number: [id number] Patient Account Number: [id number] Date of Birth/Sex: 1922/02/01 (85 y.o. M) Treating RN: Harold Barban Primary Care Llana Deshazo: Dion Body Other Clinician: Referring Justinian Miano: Dion Body Treating Vallie Teters/Extender: Melburn Hake, HOYT Weeks in Treatment: 2 Clinic Level of Care Assessment Items TOOL 3 Quantity Score []  - Use when EandM and Procedure is performed on FOLLOW-UP visit 0 ASSESSMENTS - Nursing Assessment / Reassessment X - Reassessment of  Co-morbidities (includes updates in patient status) 1 10 X- 1 5 Reassessment of Adherence to Treatment Plan ASSESSMENTS - Wound and Skin Assessment / Reassessment []  - Points for Wound Assessment can only be taken for a new wound of unknown or different 0 etiology and a procedure is NOT performed to that wound X- 1 5 Simple Wound Assessment / Reassessment - one wound []  - 0 Complex Wound Assessment / Reassessment - multiple wounds []  - 0 Dermatologic / Skin Assessment (not related to wound area) ASSESSMENTS - Focused Assessment []  - Circumferential Edema Measurements - multi extremities 0 []  - 0 Nutritional Assessment / Counseling / Intervention []  - 0 Lower Extremity Assessment (monofilament, tuning fork, pulses) []  - 0 Peripheral Arterial Disease Assessment (using hand held doppler) ASSESSMENTS - Ostomy and/or Continence Assessment and Care []  - Incontinence Assessment and Management 0 []  - 0 Ostomy Care Assessment and Management (repouching, etc.) PROCESS - Coordination of Care []  - Points for Discharge Coordination can only be taken for a new wound of unknown or 0 different etiology and a procedure is NOT performed to that wound X- 1 15 Simple Patient / Family Education for ongoing care []  - 0 Complex (extensive) Patient / Family Education for ongoing care []  - 0 Staff obtains Programmer, systems, Records, Test Results / Process Orders []  - 0 Staff telephones HHA, Nursing Homes / Clarify orders / etc []  - 0 Routine Transfer to another Facility (non-emergent condition) []  - 0 Routine Hospital Admission (non-emergent condition) TENG, DECOU ([id number]) []  - 0 New Admissions / Biomedical engineer / Ordering NPWT, Apligraf, etc. []  - 0 Emergency Hospital Admission (emergent condition) X- 1 10 Simple Discharge Coordination []  - 0 Complex (extensive) Discharge Coordination PROCESS - Special Needs []  - Pediatric / Minor Patient Management 0 []  - 0 Isolation Patient  Management []  - 0 Hearing / Language / Visual special needs []  - 0 Assessment of Community assistance (transportation, D/C planning, etc.) []  - 0  Additional assistance / Altered mentation []  - 0 Support Surface(s) Assessment (bed, cushion, seat, etc.) INTERVENTIONS - Wound Cleansing / Measurement []  - Points for Wound Cleaning / Measurement, Wound Dressing, Specimen Collection and 0 Specimen taken to lab can only be taken for a new wound of unknown or different etiology and a procedure is NOT performed to that wound X- 1 5 Simple Wound Cleansing - one wound []  - 0 Complex Wound Cleansing - multiple wounds X- 1 5 Wound Imaging (photographs - any number of wounds) []  - 0 Wound Tracing (instead of photographs) X- 1 5 Simple Wound Measurement - one wound []  - 0 Complex Wound Measurement - multiple wounds INTERVENTIONS - Wound Dressings X - Small Wound Dressing one or multiple wounds 1 10 []  - 0 Medium Wound Dressing one or multiple wounds []  - 0 Large Wound Dressing one or multiple wounds INTERVENTIONS - Miscellaneous []  - External ear exam 0 []  - 0 Specimen Collection (cultures, biopsies, blood, body fluids, etc.) []  - 0 Specimen(s) / Culture(s) sent or taken to Lab for analysis []  - 0 Patient Transfer (multiple staff / Civil Service fast streamer / Similar devices) []  - 0 Simple Staple / Suture removal (25 or less) []  - 0 Complex Staple / Suture removal (26 or more) ANDRUW, BATTIE. ([id number]) []  - 0 Hypo / Hyperglycemic Management (close monitor of Blood Glucose) []  - 0 Ankle / Brachial Index (ABI) - do not check if billed separately X- 1 5 Vital Signs Has the patient been seen at the hospital within the last three years: Yes Total Score: 75 Level Of Care: New/Established - Level 2 Electronic Signature(s) Signed: 06/04/2019 9:59:53 AM By: Harold Barban Entered By: Harold Barban on 06/04/2019 09:59:20 Alexander Hogan  ([id number]) -------------------------------------------------------------------------------- Compression Therapy Details Patient Name: Alexander Hogan Date of Service: 06/04/2019 9:30 AM Medical Record Number: [id number] Patient Account Number: [id number] Date of Birth/Sex: [birth year] (85 y.o. M) Treating RN: Harold Barban Primary Care Herndon Grill: Dion Body Other Clinician: Referring Joniya Boberg: Dion Body Treating Madhavi Hamblen/Extender: Melburn Hake, HOYT Weeks in Treatment: 2 Compression Therapy Performed for Wound Assessment: Wound #1 Left,Medial Lower Leg Performed By: Clinician Harold Barban, RN Compression Type: Three Layer Electronic Signature(s) Signed: 06/04/2019 9:57:43 AM By: Harold Barban Entered By: Harold Barban on 06/04/2019 09:57:43 Alexander Hogan ([id number]) -------------------------------------------------------------------------------- Encounter Discharge Information Details Patient Name: Alexander Hogan Date of Service: 06/04/2019 9:30 AM Medical Record Number: [id number] Patient Account Number: [id number] Date of Birth/Sex: 08/06/1922 (85 y.o. M) Treating RN: Harold Barban Primary Care Jaymere Alen: Dion Body Other Clinician: Referring Rollo Farquhar: Dion Body Treating Ipek Westra/Extender: Melburn Hake, HOYT Weeks in Treatment: 2 Encounter Discharge Information Items Discharge Condition: Stable Ambulatory Status: Wheelchair Discharge Destination: Home Transportation: Private Auto Accompanied By: self Schedule Follow-up Appointment: Yes Clinical Summary of Care: Electronic Signature(s) Signed: 06/04/2019 9:58:46 AM By: Harold Barban Entered By: Harold Barban on 06/04/2019 09:58:46 Alexander Hogan ([id number]) -------------------------------------------------------------------------------- Wound Assessment Details Patient Name: Alexander Hogan Date of Service: 06/04/2019 9:30 AM Medical Record Number: [id number] Patient Account  Number: [id number] Date of Birth/Sex: 23-Dec-1921 (85 y.o. M) Treating RN: Harold Barban Primary Care Quitman Norberto: Dion Body Other Clinician: Referring Madysun Thall: Dion Body Treating Takoda Janowiak/Extender: Melburn Hake, HOYT Weeks in Treatment: 2 Wound Status Wound Number: 1 Primary Venous Leg Ulcer Etiology: Wound Location: Left Lower Leg - Medial Wound Open Wounding Event: Surgical Injury Status: Date Acquired: 02/01/2019 Comorbid Anemia, Coronary Artery Disease, Weeks Of Treatment: 2 History: Hypertension, Received Chemotherapy, Clustered Wound: No Received Radiation Photos Wound Measurements  Length: (cm) 0.5 Width: (cm) 0.5 Depth: (cm) 0.1 Area: (cm) 0.196 Volume: (cm) 0.02 % Reduction in Area: 53.8% % Reduction in Volume: 52.4% Epithelialization: None Tunneling: No Undermining: No Wound Description Full Thickness Without Exposed Support Foul Odo Classification: Structures Slough/F Wound Margin: Flat and Intact Exudate Medium Amount: Exudate Type: Serous Exudate Color: amber r After Cleansing: No ibrino Yes Wound Bed Granulation Amount: None Present (0%) Exposed Structure Necrotic Amount: Large (67-100%) Fascia Exposed: No Necrotic Quality: Adherent Slough Fat Layer (Subcutaneous Tissue) Exposed: Yes Tendon Exposed: No Muscle Exposed: No Joint Exposed: No Bone Exposed: No ARGIE, APPLEGATE ([id number]) Electronic Signature(s) Signed: 06/04/2019 9:59:53 AM By: Harold Barban Entered By: Harold Barban on 06/04/2019 09:51:05

## 2019-06-11 ENCOUNTER — Other Ambulatory Visit: Payer: Self-pay

## 2019-06-11 ENCOUNTER — Encounter: Payer: Medicare Other | Admitting: Physician Assistant

## 2019-06-11 DIAGNOSIS — L97822 Non-pressure chronic ulcer of other part of left lower leg with fat layer exposed: Secondary | ICD-10-CM | POA: Diagnosis not present

## 2019-06-14 NOTE — Progress Notes (Signed)
CHANLER, MENDONCA ([id number]) Visit Report for 06/11/2019 Arrival Information Details Patient Name: Alexander Hogan, Alexander Hogan Date of Service: 06/11/2019 3:00 PM Medical Record Number: [id number] Patient Account Number: [id number] Date of Birth/Sex: 1922-07-31 (85 y.o. M) Treating RN: Cornell Barman Primary Care Develle Sievers: Dion Body Other Clinician: Referring Yadhira Mckneely: Dion Body Treating Enriqueta Augusta/Extender: Melburn Hake, HOYT Weeks in Treatment: 3 Visit Information History Since Last Visit Added or deleted any medications: No Patient Arrived: Wheel Chair Any new allergies or adverse reactions: No Arrival Time: 14:55 Had a fall or experienced change in No Accompanied By: wife activities of daily living that may affect Transfer Assistance: Manual risk of Alexander Hogan: Patient Identification Verified: Yes Signs or symptoms of abuse/neglect since last visito No Secondary Verification Process Completed: Yes Hospitalized since last visit: No Implantable device outside of the clinic excluding No cellular tissue based products placed in the center since last visit: Has Dressing in Place as Prescribed: Yes Has Compression in Place as Prescribed: Yes Pain Present Now: No Electronic Signature(s) Signed: 06/11/2019 5:18:52 PM By: Gretta Cool, BSN, RN, CWS, Kim RN, BSN Entered By: Gretta Cool, BSN, RN, CWS, Kim on 06/11/2019 14:56:19 Alexander Hogan ([id number]) -------------------------------------------------------------------------------- Clinic Level of Care Assessment Details Patient Name: Alexander Hogan Date of Service: 06/11/2019 3:00 PM Medical Record Number: [id number] Patient Account Number: [id number] Date of Birth/Sex: 04-12-1922 (85 y.o. M) Treating RN: Army Melia Primary Care Anishka Bushard: Dion Body Other Clinician: Referring Westen Dinino: Dion Body Treating Careli Luzader/Extender: Melburn Hake, HOYT Weeks in Treatment: 3 Clinic Level of Care Assessment Items TOOL 4 Quantity Score []   - Use when only an EandM is performed on FOLLOW-UP visit 0 ASSESSMENTS - Nursing Assessment / Reassessment []  - Reassessment of Co-morbidities (includes updates in patient status) 0 X- 1 5 Reassessment of Adherence to Treatment Plan ASSESSMENTS - Wound and Skin Assessment / Reassessment X - Simple Wound Assessment / Reassessment - one wound 1 5 []  - 0 Complex Wound Assessment / Reassessment - multiple wounds []  - 0 Dermatologic / Skin Assessment (not related to wound area) ASSESSMENTS - Focused Assessment []  - Circumferential Edema Measurements - multi extremities 0 []  - 0 Nutritional Assessment / Counseling / Intervention []  - 0 Lower Extremity Assessment (monofilament, tuning fork, pulses) []  - 0 Peripheral Arterial Disease Assessment (using hand held doppler) ASSESSMENTS - Ostomy and/or Continence Assessment and Care []  - Incontinence Assessment and Management 0 []  - 0 Ostomy Care Assessment and Management (repouching, etc.) PROCESS - Coordination of Care X - Simple Patient / Family Education for ongoing care 1 15 []  - 0 Complex (extensive) Patient / Family Education for ongoing care []  - 0 Staff obtains Programmer, systems, Records, Test Results / Process Orders []  - 0 Staff telephones HHA, Nursing Homes / Clarify orders / etc []  - 0 Routine Transfer to another Facility (non-emergent condition) []  - 0 Routine Hospital Admission (non-emergent condition) []  - 0 New Admissions / Biomedical engineer / Ordering NPWT, Apligraf, etc. []  - 0 Emergency Hospital Admission (emergent condition) X- 1 10 Simple Discharge Coordination JAMA, KRICHBAUM ([id number]) []  - 0 Complex (extensive) Discharge Coordination PROCESS - Special Needs []  - Pediatric / Minor Patient Management 0 []  - 0 Isolation Patient Management []  - 0 Hearing / Language / Visual special needs []  - 0 Assessment of Community assistance (transportation, D/C planning, etc.) []  - 0 Additional assistance / Altered  mentation []  - 0 Support Surface(s) Assessment (bed, cushion, seat, etc.) INTERVENTIONS - Wound Cleansing / Measurement X - Simple Wound Cleansing - one wound 1  5 []  - 0 Complex Wound Cleansing - multiple wounds X- 1 5 Wound Imaging (photographs - any number of wounds) []  - 0 Wound Tracing (instead of photographs) X- 1 5 Simple Wound Measurement - one wound []  - 0 Complex Wound Measurement - multiple wounds INTERVENTIONS - Wound Dressings X - Small Wound Dressing one or multiple wounds 1 10 []  - 0 Medium Wound Dressing one or multiple wounds []  - 0 Large Wound Dressing one or multiple wounds []  - 0 Application of Medications - topical []  - 0 Application of Medications - injection INTERVENTIONS - Miscellaneous []  - External ear exam 0 []  - 0 Specimen Collection (cultures, biopsies, blood, body fluids, etc.) []  - 0 Specimen(s) / Culture(s) sent or taken to Lab for analysis []  - 0 Patient Transfer (multiple staff / Civil Service fast streamer / Similar devices) []  - 0 Simple Staple / Suture removal (25 or less) []  - 0 Complex Staple / Suture removal (26 or more) []  - 0 Hypo / Hyperglycemic Management (close monitor of Blood Glucose) []  - 0 Ankle / Brachial Index (ABI) - do not check if billed separately X- 1 5 Vital Signs CATARINO, VOLD ([id number]) Has the patient been seen at the hospital within the last three years: Yes Total Score: 65 Level Of Care: New/Established - Level 2 Electronic Signature(s) Signed: 06/11/2019 3:37:18 PM By: Army Melia Entered By: Army Melia on 06/11/2019 15:31:45 Alexander Hogan ([id number]) -------------------------------------------------------------------------------- Encounter Discharge Information Details Patient Name: Alexander Hogan Date of Service: 06/11/2019 3:00 PM Medical Record Number: [id number] Patient Account Number: [id number] Date of Birth/Sex: 01/02/1922 (85 y.o. M) Treating RN: Army Melia Primary Care Maizee Reinhold: Dion Body Other Clinician: Referring Myrna Vonseggern: Dion Body Treating Nohealani Medinger/Extender: Melburn Hake, HOYT Weeks in Treatment: 3 Encounter Discharge Information Items Discharge Condition: Stable Ambulatory Status: Wheelchair Discharge Destination: Home Transportation: Private Auto Accompanied By: spouse Schedule Follow-up Appointment: Yes Clinical Summary of Care: Electronic Signature(s) Signed: 06/11/2019 3:37:18 PM By: Army Melia Entered By: Army Melia on 06/11/2019 15:33:07 Alexander Hogan ([id number]) -------------------------------------------------------------------------------- Lower Extremity Assessment Details Patient Name: Alexander Hogan Date of Service: 06/11/2019 3:00 PM Medical Record Number: [id number] Patient Account Number: [id number] Date of Birth/Sex: Sep 09, 1922 (85 y.o. M) Treating RN: Cornell Barman Primary Care Hearl Heikes: Dion Body Other Clinician: Referring Marquay Kruse: Dion Body Treating Allex Madia/Extender: Melburn Hake, HOYT Weeks in Treatment: 3 Edema Assessment Assessed: [Left: No] [Right: No] Edema: [Left: Ye] [Right: s] Calf Left: Right: Point of Measurement: 34 cm From Medial Instep 34 cm cm Ankle Left: Right: Point of Measurement: 13 cm From Medial Instep 27 cm cm Vascular Assessment Pulses: Dorsalis Pedis Palpable: [Left:Yes] Electronic Signature(s) Signed: 06/11/2019 5:18:52 PM By: Gretta Cool, BSN, RN, CWS, Kim RN, BSN Entered By: Gretta Cool, BSN, RN, CWS, Kim on 06/11/2019 15:03:32 Alexander Hogan ([id number]) -------------------------------------------------------------------------------- Multi Wound Chart Details Patient Name: Alexander Hogan Date of Service: 06/11/2019 3:00 PM Medical Record Number: [id number] Patient Account Number: [id number] Date of Birth/Sex: 04/22/1922 (85 y.o. M) Treating RN: Army Melia Primary Care Arine Foley: Dion Body Other Clinician: Referring Charlton Boule: Dion Body Treating  Larra Crunkleton/Extender: Melburn Hake, HOYT Weeks in Treatment: 3 Vital Signs Height(in): 70 Pulse(bpm): 60 Weight(lbs): 198 Blood Pressure(mmHg): 128/68 Body Mass Index(BMI): 28 Temperature(F): 98.2 Respiratory Rate 16 (breaths/min): Photos: [N/A:N/A] Wound Location: Left Lower Leg - Medial N/A N/A Wounding Event: Surgical Injury N/A N/A Primary Etiology: Venous Leg Ulcer N/A N/A Comorbid History: Anemia, Coronary Artery N/A N/A Disease, Hypertension, Received Chemotherapy, Received Radiation Date Acquired: 02/01/2019 N/A N/A Weeks of  Treatment: 3 N/A N/A Wound Status: Healed - Epithelialized N/A N/A Measurements L x W x D 0x0x0 N/A N/A (cm) Area (cm) : 0 N/A N/A Volume (cm) : 0 N/A N/A % Reduction in Area: 100.00% N/A N/A % Reduction in Volume: 100.00% N/A N/A Classification: Full Thickness Without N/A N/A Exposed Support Structures Exudate Amount: None Present N/A N/A Wound Margin: Flat and Intact N/A N/A Granulation Amount: None Present (0%) N/A N/A Necrotic Amount: None Present (0%) N/A N/A Exposed Structures: Fascia: No N/A N/A Fat Layer (Subcutaneous Tissue) Exposed: No Tendon: No Muscle: No Joint: No PRESCOTT, TRUEX ([id number]) Bone: No Limited to Skin Breakdown Epithelialization: Large (67-100%) N/A N/A Treatment Notes Electronic Signature(s) Signed: 06/11/2019 3:37:18 PM By: Army Melia Entered By: Army Melia on 06/11/2019 15:21:06 Alexander Hogan ([id number]) -------------------------------------------------------------------------------- [place] Details Patient Name: Alexander Hogan Date of Service: 06/11/2019 3:00 PM Medical Record Number: [id number] Patient Account Number: [id number] Date of Birth/Sex: Mar 28, 1922 (85 y.o. M) Treating RN: Army Melia Primary Care Keng Jewel: Dion Body Other Clinician: Referring Ludene Stokke: Dion Body Treating Denean Pavon/Extender: Melburn Hake, HOYT Weeks in Treatment: 3 Active  Inactive Electronic Signature(s) Signed: 06/11/2019 3:37:18 PM By: Army Melia Entered By: Army Melia on 06/11/2019 15:32:36 Alexander Hogan ([id number]) -------------------------------------------------------------------------------- Pain Assessment Details Patient Name: Alexander Hogan Date of Service: 06/11/2019 3:00 PM Medical Record Number: [id number] Patient Account Number: [id number] Date of Birth/Sex: June 19, 1922 (85 y.o. M) Treating RN: Cornell Barman Primary Care Jashiya Bassett: Dion Body Other Clinician: Referring Latrel Szymczak: Dion Body Treating Atharva Mirsky/Extender: Melburn Hake, HOYT Weeks in Treatment: 3 Active Problems Location of Pain Severity and Description of Pain Patient Has Paino No Site Locations Pain Management and Medication Current Pain Management: Electronic Signature(s) Signed: 06/11/2019 5:18:52 PM By: Gretta Cool, BSN, RN, CWS, Kim RN, BSN Entered By: Gretta Cool, BSN, RN, CWS, Kim on 06/11/2019 14:58:05 Alexander Hogan ([id number]) -------------------------------------------------------------------------------- Patient/Caregiver Education Details Patient Name: Alexander Hogan Date of Service: 06/11/2019 3:00 PM Medical Record Number: [id number] Patient Account Number: [id number] Date of Birth/Gender: 03-May-1922 (85 y.o. M) Treating RN: Army Melia Primary Care Physician: Dion Body Other Clinician: Referring Physician: Dion Body Treating Physician/Extender: Sharalyn Ink in Treatment: 3 Education Assessment Education Provided To: Patient Education Topics Provided Venous: Handouts: Controlling Swelling with Compression Stockings Methods: Demonstration, Explain/Verbal Responses: State content correctly Electronic Signature(s) Signed: 06/11/2019 3:37:18 PM By: Army Melia Entered By: Army Melia on 06/11/2019 15:32:10 Alexander Hogan  ([id number]) -------------------------------------------------------------------------------- Wound Assessment Details Patient Name: Alexander Hogan Date of Service: 06/11/2019 3:00 PM Medical Record Number: [id number] Patient Account Number: [id number] Date of Birth/Sex: 1922-01-05 (85 y.o. M) Treating RN: Cornell Barman Primary Care Nidhi Jacome: Dion Body Other Clinician: Referring Irena Gaydos: Dion Body Treating Tylar Merendino/Extender: Melburn Hake, HOYT Weeks in Treatment: 3 Wound Status Wound Number: 1 Primary Venous Leg Ulcer Etiology: Wound Location: Left Lower Leg - Medial Wound Healed - Epithelialized Wounding Event: Surgical Injury Status: Date Acquired: 02/01/2019 Comorbid Anemia, Coronary Artery Disease, Weeks Of Treatment: 3 History: Hypertension, Received Chemotherapy, Clustered Wound: No Received Radiation Photos Wound Measurements Length: (cm) 0 % Reducti Width: (cm) 0 % Reducti Depth: (cm) 0 Epithelia Area: (cm) 0 Tunnelin Volume: (cm) 0 Undermin on in Area: 100% on in Volume: 100% lization: Large (67-100%) g: No ing: No Wound Description Full Thickness Without Exposed Support Foul Odor Classification: Structures Slough/Fi Wound Margin: Flat and Intact Exudate None Present Amount: After Cleansing: No brino No Wound Bed Granulation Amount: None Present (0%) Exposed Structure Necrotic Amount: None Present (0%)  Fascia Exposed: No Fat Layer (Subcutaneous Tissue) Exposed: No Tendon Exposed: No Muscle Exposed: No Joint Exposed: No Bone Exposed: No Limited to [address] TAYTUM, WHELLER ([id number]) Electronic Signature(s) Signed: 06/11/2019 3:37:18 PM By: Army Melia Signed: 06/11/2019 5:18:52 PM By: Gretta Cool, BSN, RN, CWS, Kim RN, BSN Entered By: Army Melia on 06/11/2019 15:20:48 Alexander Hogan ([id number]) -------------------------------------------------------------------------------- [place] Details Patient Name: Alexander Hogan Date of Service: 06/11/2019 3:00 PM Medical Record Number: [id number] Patient Account Number: [id number] Date of Birth/Sex: 1922/07/16 (85 y.o. M) Treating RN: Cornell Barman Primary Care Kayle Passarelli: Dion Body Other Clinician: Referring Atzin Buchta: Dion Body Treating Nilani Hugill/Extender: Melburn Hake, HOYT Weeks in Treatment: 3 Vital Signs Time Taken: 14:58 Temperature (F): 98.2 Height (in): 70 Pulse (bpm): 92 Weight (lbs): 198 Respiratory Rate (breaths/min): 16 Body Mass Index (BMI): 28.4 Blood Pressure (mmHg): 128/68 Reference Range: 80 - 120 mg / dl Electronic Signature(s) Signed: 06/11/2019 5:18:52 PM By: Gretta Cool, BSN, RN, CWS, Kim RN, BSN Entered By: Gretta Cool, BSN, RN, CWS, Kim on 06/11/2019 14:58:26

## 2019-06-15 NOTE — Progress Notes (Signed)
ROBBY, BULKLEY ([id number]) Visit Report for 06/11/2019 Chief Complaint Document Details Patient Name: Alexander Hogan, Alexander Hogan Date of Service: 06/11/2019 3:00 PM Medical Record Number: [id number] Patient Account Number: [id number] Date of Birth/Sex: Jul 27, 1922 (85 y.o. M) Treating RN: Army Melia Primary Care Provider: Dion Body Other Clinician: Referring Provider: Dion Body Treating Provider/Extender: Melburn Hake, Nikky Duba Weeks in Treatment: 3 Information Obtained from: Patient Chief Complaint Left LE Ulcer Electronic Signature(s) Signed: 06/14/2019 4:13:38 PM By: Worthy Keeler PA-C Entered By: Worthy Keeler on 06/11/2019 14:59:47 Alexander Hogan ([id number]) -------------------------------------------------------------------------------- HPI Details Patient Name: Alexander Hogan Date of Service: 06/11/2019 3:00 PM Medical Record Number: [id number] Patient Account Number: [id number] Date of Birth/Sex: 03/20/1922 (85 y.o. M) Treating RN: Army Melia Primary Care Provider: Dion Body Other Clinician: Referring Provider: Dion Body Treating Provider/Extender: Melburn Hake, Gayle Collard Weeks in Treatment: 3 History of Present Illness HPI Description: 05/21/19 on evaluation today patient actually appears to be doing poorly in regard to his left lower extremity in regard to a small area where there is some question of what exactly happened. This is the initial evaluation here in our clinic but apparently the patient did have a surgical procedure to remove something that was questionable skin cancer. With that being said according to what the wife of the patient tell me this did not seem to end up being the case and he's just had a hard time getting this area to heal. Fortunately there's no signs of active infection at this time. He does have a history of metastatic melanoma of the right lower extremity he goes to [facility] and is being treated there on a regular basis or at  least was until the Covid-19 Virus pandemic. He also has a history of hypertension and chronic kidney disease stage III. 05/28/19 on evaluation today patient appears to be doing quite well in regard to the ulcer on his lower extremity. You select that the compression wrap has been beneficial for him this is excellent news. Subsequently I do think we may want to switch it dressing a little bit today just based on the appearance of the wound which is actually doing very well. 06/11/19 on evaluation today patient actually appears to be completely healed which is excellent news. He has been tolerating the compression without complication. Fortunately there's no signs of active infection or open wound. Electronic Signature(s) Signed: 06/14/2019 4:13:38 PM By: Worthy Keeler PA-C Entered By: Worthy Keeler on 06/14/2019 01:31:14 Alexander Hogan ([id number]) -------------------------------------------------------------------------------- Physical Exam Details Patient Name: Alexander Hogan Date of Service: 06/11/2019 3:00 PM Medical Record Number: [id number] Patient Account Number: [id number] Date of Birth/Sex: 11-Aug-1922 (85 y.o. M) Treating RN: Army Melia Primary Care Provider: Dion Body Other Clinician: Referring Provider: Dion Body Treating Provider/Extender: Melburn Hake, Kyleen Villatoro Weeks in Treatment: 3 Constitutional Well-nourished and well-hydrated in no acute distress. Respiratory normal breathing without difficulty. clear to auscultation bilaterally. Cardiovascular regular rate and rhythm with normal S1, S2. Psychiatric this patient is able to make decisions and demonstrates good insight into disease process. Alert and Oriented x 3. pleasant and cooperative. Notes Patient's wound bed currently appears to show signs of complete epithelialization which is excellent news. There's no signs of active infection. I do believe that he is gonna need to compression stockings for  the left lower extremity in order to keep the swelling out of control and thus prevent reopening. Electronic Signature(s) Signed: 06/14/2019 4:13:38 PM By: Worthy Keeler PA-C Entered By: Worthy Keeler on 06/14/2019  01:31:51 Alexander Hogan, Alexander Hogan ([id number]) -------------------------------------------------------------------------------- Physician Orders Details Patient Name: Alexander Hogan Date of Service: 06/11/2019 3:00 PM Medical Record Number: [id number] Patient Account Number: [id number] Date of Birth/Sex: Feb 12, 1922 (85 y.o. M) Treating RN: Army Melia Primary Care Provider: Dion Body Other Clinician: Referring Provider: Dion Body Treating Provider/Extender: Melburn Hake, Makya Yurko Weeks in Treatment: 3 Verbal / Phone Orders: No Diagnosis Coding ICD-10 Coding Code Description I87.2 Venous insufficiency (chronic) (peripheral) L97.822 Non-pressure chronic ulcer of other part of left lower leg with fat layer exposed I10 Essential (primary) hypertension N18.3 Chronic kidney disease, stage 3 (moderate) C43.9 Malignant melanoma of skin, unspecified Secondary Dressing o Other - double layer tubi grip Electronic Signature(s) Signed: 06/11/2019 3:37:18 PM By: Army Melia Signed: 06/14/2019 4:13:38 PM By: Worthy Keeler PA-C Entered By: Army Melia on 06/11/2019 15:21:57 Alexander Hogan ([id number]) -------------------------------------------------------------------------------- Problem List Details Patient Name: Alexander Hogan Date of Service: 06/11/2019 3:00 PM Medical Record Number: [id number] Patient Account Number: [id number] Date of Birth/Sex: May 06, 1922 (85 y.o. M) Treating RN: Army Melia Primary Care Provider: Dion Body Other Clinician: Referring Provider: Dion Body Treating Provider/Extender: Melburn Hake, Janani Chamber Weeks in Treatment: 3 Active Problems ICD-10 Evaluated Encounter Code Description Active Date Today Diagnosis I87.2 Venous  insufficiency (chronic) (peripheral) 05/21/2019 No Yes L97.822 Non-pressure chronic ulcer of other part of left lower leg with 05/21/2019 No Yes fat layer exposed [place] (primary) hypertension 05/21/2019 No Yes N18.3 Chronic kidney disease, stage 3 (moderate) 05/21/2019 No Yes C43.9 Malignant melanoma of skin, unspecified 05/21/2019 No Yes Inactive Problems Resolved Problems Electronic Signature(s) Signed: 06/14/2019 4:13:38 PM By: Worthy Keeler PA-C Entered By: Worthy Keeler on 06/11/2019 14:59:39 Alexander Hogan ([id number]) -------------------------------------------------------------------------------- Progress Note Details Patient Name: Alexander Hogan Date of Service: 06/11/2019 3:00 PM Medical Record Number: [id number] Patient Account Number: [id number] Date of Birth/Sex: May 30, 1922 (85 y.o. M) Treating RN: Army Melia Primary Care Provider: Dion Body Other Clinician: Referring Provider: Dion Body Treating Provider/Extender: Melburn Hake, Tuwanda Vokes Weeks in Treatment: 3 Subjective Chief Complaint Information obtained from Patient Left LE Ulcer History of Present Illness (HPI) 05/21/19 on evaluation today patient actually appears to be doing poorly in regard to his left lower extremity in regard to a small area where there is some question of what exactly happened. This is the initial evaluation here in our clinic but apparently the patient did have a surgical procedure to remove something that was questionable skin cancer. With that being said according to what the wife of the patient tell me this did not seem to end up being the case and he's just had a hard time getting this area to heal. Fortunately there's no signs of active infection at this time. He does have a history of metastatic melanoma of the right lower extremity he goes to [facility] and is being treated there on a regular basis or at least was until the Covid-19 Virus pandemic. He also has a history  of hypertension and chronic kidney disease stage III. 05/28/19 on evaluation today patient appears to be doing quite well in regard to the ulcer on his lower extremity. You select that the compression wrap has been beneficial for him this is excellent news. Subsequently I do think we may want to switch it dressing a little bit today just based on the appearance of the wound which is actually doing very well. 06/11/19 on evaluation today patient actually appears to be completely healed which is excellent news. He has been tolerating the compression without complication.  Fortunately there's no signs of active infection or open wound. Patient History Information obtained from Patient. Family History Cancer - Mother,Father, Heart Disease - Father, Hypertension - Father, Lung Disease - Father, No family history of Diabetes, Hereditary Spherocytosis, Kidney Disease, Stroke, Thyroid Problems, Tuberculosis. Social History Never smoker, Marital Status - Married, Alcohol Use - Never, Drug Use - No History, Caffeine Use - Never. Medical History Hematologic/Lymphatic Patient has history of Anemia Denies history of Hemophilia, Human Immunodeficiency Virus, Lymphedema, Sickle Cell Disease Cardiovascular Patient has history of Coronary Artery Disease, Hypertension Denies history of Angina, Arrhythmia, Congestive Heart Failure, Deep Vein Thrombosis, Hypotension, Myocardial Infarction, Peripheral Arterial Disease, Peripheral Venous Disease, Phlebitis, Vasculitis Genitourinary Denies history of End Stage Renal Disease Integumentary (Skin) Denies history of History of Burn, History of pressure wounds Oncologic Patient has history of Received Chemotherapy, Received Radiation Alexander Hogan, Alexander Hogan ([id number]) Medical And Surgical History Notes Cardiovascular HLD Genitourinary CKD stage 3 Oncologic Prostate cancer 1991 - chemo, radiation and surgery; 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 Respiratory Denies complaints or symptoms of Chronic or frequent coughs, Shortness of Breath. Cardiovascular Complains or has symptoms of LE edema. Denies complaints or symptoms of Chest pain. Psychiatric Denies complaints or symptoms of Anxiety, Claustrophobia. Objective Constitutional Well-nourished and well-hydrated in no acute distress. Vitals Time Taken: 2:58 PM, Height: 70 in, Weight: 198 lbs, BMI: 28.4, Temperature: 98.2 F, Pulse: 92 bpm, Respiratory Rate: 16 breaths/min, Blood Pressure: 128/68 mmHg. Respiratory normal breathing without difficulty. clear to auscultation bilaterally. Cardiovascular regular rate and rhythm with normal S1, S2. Psychiatric this patient is able to make decisions and demonstrates good insight into disease process. Alert and Oriented x 3. pleasant and cooperative. General Notes: Patient's wound bed currently appears to show signs of complete epithelialization which is excellent news. There's no signs of active infection. I do believe that he is gonna need to compression stockings for the left lower extremity in order to keep the swelling out of control and thus prevent reopening. Integumentary (Hair, Skin) Wound #1 status is Healed - Epithelialized. Original cause of wound was Surgical Injury. The wound is located on the Left,Medial Lower Leg. The wound measures 0cm length x 0cm width x 0cm depth; 0cm^2 area and 0cm^3 volume. The wound is limited to skin breakdown. There is no tunneling or undermining noted. There is a none present amount of drainage noted. The wound margin is flat and intact. There is no granulation within the wound bed. There is no necrotic tissue within the wound bed. Alexander Hogan, Alexander Hogan ([id number]) Assessment Active Problems ICD-10 Venous insufficiency  (chronic) (peripheral) Non-pressure chronic ulcer of other part of left lower leg with fat layer exposed Essential (primary) hypertension Chronic kidney disease, stage 3 (moderate) Malignant melanoma of skin, unspecified Plan Secondary Dressing: Other - double layer tubi grip I did have a lengthy conversation with the patient's wife today concerning compression stockings and the fact that he needs to have these to be worn daily. We also discussed where to get these and we did get her the information for elastic therapy along with measurements so she can come in order compression stockings for left lower extremity. Subsequently also showed her the assistive device to help put on the stockings which I think will also be beneficial for her. I explained she can likely get this advanced wound care. I will recommend that  he use the compression stockings pretty much anytime he's up during the day he can take it off at nighttime. We will see him back as needed at this point. Electronic Signature(s) Signed: 06/14/2019 4:13:38 PM By: Worthy Keeler PA-C Entered By: Worthy Keeler on 06/14/2019 01:32:53 Alexander Hogan ([id number]) -------------------------------------------------------------------------------- ROS/PFSH Details Patient Name: Alexander Hogan Date of Service: 06/11/2019 3:00 PM Medical Record Number: [id number] Patient Account Number: [id number] Date of Birth/Sex: 02-09-1922 (85 y.o. M) Treating RN: Army Melia Primary Care Provider: Dion Body Other Clinician: Referring Provider: Dion Body Treating Provider/Extender: Melburn Hake, Dalessandro Baldyga Weeks in Treatment: 3 Information Obtained From Patient Constitutional Symptoms (General Health) Complaints and Symptoms: Negative for: Fatigue; Fever; Chills; Marked Weight Change Respiratory Complaints and Symptoms: Negative for: Chronic or frequent coughs; Shortness of Breath Cardiovascular Complaints and  Symptoms: Positive for: LE edema Negative for: Chest pain Medical History: Positive for: Coronary Artery Disease; Hypertension Negative for: Angina; Arrhythmia; Congestive Heart Failure; Deep Vein Thrombosis; Hypotension; Myocardial Infarction; Peripheral Arterial Disease; Peripheral Venous Disease; Phlebitis; Vasculitis Past Medical History Notes: HLD Psychiatric Complaints and Symptoms: Negative for: Anxiety; Claustrophobia Hematologic/Lymphatic Medical History: Positive for: Anemia Negative for: Hemophilia; Human Immunodeficiency Virus; Lymphedema; Sickle Cell Disease Genitourinary Medical History: Negative for: End Stage Renal Disease Past Medical History Notes: CKD stage 3 Integumentary (Skin) Medical History: Negative for: History of Burn; History of pressure wounds Oncologic Alexander Hogan, Alexander Hogan ([id number]) Medical History: Positive for: Received Chemotherapy; Received Radiation Past Medical History Notes: Prostate cancer 1991 - chemo, radiation and surgery; 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; metastatic melamona - current Immunizations Pneumococcal Vaccine: Received Pneumococcal Vaccination: Yes Implantable Devices None Family and Social History Cancer: Yes - Mother,Father; Diabetes: No; Heart Disease: Yes - Father; Hereditary Spherocytosis: No; Hypertension: Yes - Father; Kidney Disease: No; Lung Disease: Yes - Father; Stroke: No; Thyroid Problems: No; Tuberculosis: No; Never smoker; Marital Status - Married; Alcohol Use: Never; Drug Use: No History; Caffeine Use: Never; Financial Concerns: No; Food, Clothing or Shelter Needs: No; Support System Lacking: No; Transportation Concerns: No Physician Affirmation I have reviewed and agree with the above information. Electronic Signature(s) Signed: 06/14/2019 4:13:38 PM By: Worthy Keeler PA-C Signed: 06/15/2019 11:19:00 AM By: Army Melia Entered By: Worthy Keeler on 06/14/2019 01:31:35 Alexander Hogan ([id number]) -------------------------------------------------------------------------------- SuperBill Details Patient Name: Alexander Hogan Date of Service: 06/11/2019 Medical Record Number: [id number] Patient Account Number: [id number] Date of Birth/Sex: May 08, 1922 (85 y.o. M) Treating RN: Army Melia Primary Care Provider: Dion Body Other Clinician: Referring Provider: Dion Body Treating Provider/Extender: Melburn Hake, See Beharry Weeks in Treatment: 3 Diagnosis Coding ICD-10 Codes Code Description I87.2 Venous insufficiency (chronic) (peripheral) L97.822 Non-pressure chronic ulcer of other part of left lower leg with fat layer exposed [place] (primary) hypertension N18.3 Chronic kidney disease, stage 3 (moderate) C43.9 Malignant melanoma of skin, unspecified Facility Procedures CPT4 Code: [id number] Description: 35465 - WOUND CARE VISIT-LEV 2 EST PT Modifier: Quantity: 1 Physician Procedures CPT4 Code Description: [id number] 70017 - WC PHYS LEVEL 3 - EST PT ICD-10 Diagnosis Description I87.2 Venous insufficiency (chronic) (peripheral) L97.822 Non-pressure chronic ulcer of other part of left lower leg wit I10 Essential (primary) hypertension  N18.3 Chronic kidney disease, stage 3 (moderate) Modifier: h fat layer expos Quantity: 1 ed Electronic Signature(s) Signed: 06/14/2019 4:13:38 PM By: Worthy Keeler PA-C Entered By: Worthy Keeler on 06/11/2019 21:44:56

## 2019-07-24 ENCOUNTER — Emergency Department: Payer: Medicare Other

## 2019-07-24 ENCOUNTER — Telehealth: Payer: Self-pay | Admitting: Urology

## 2019-07-24 ENCOUNTER — Other Ambulatory Visit: Payer: Self-pay

## 2019-07-24 ENCOUNTER — Emergency Department
Admission: EM | Admit: 2019-07-24 | Discharge: 2019-07-24 | Disposition: A | Discharge status: Home / Self Care | Payer: Medicare Other | Attending: Emergency Medicine | Admitting: Emergency Medicine

## 2019-07-24 DIAGNOSIS — Z85828 Personal history of other malignant neoplasm of skin: Secondary | ICD-10-CM | POA: Diagnosis not present

## 2019-07-24 DIAGNOSIS — N5089 Other specified disorders of the male genital organs: Secondary | ICD-10-CM

## 2019-07-24 DIAGNOSIS — Z8546 Personal history of malignant neoplasm of prostate: Secondary | ICD-10-CM | POA: Insufficient documentation

## 2019-07-24 DIAGNOSIS — R339 Retention of urine, unspecified: Secondary | ICD-10-CM

## 2019-07-24 DIAGNOSIS — Z79899 Other long term (current) drug therapy: Secondary | ICD-10-CM | POA: Diagnosis not present

## 2019-07-24 DIAGNOSIS — F0390 Unspecified dementia without behavioral disturbance: Secondary | ICD-10-CM | POA: Insufficient documentation

## 2019-07-24 DIAGNOSIS — N184 Chronic kidney disease, stage 4 (severe): Secondary | ICD-10-CM | POA: Diagnosis not present

## 2019-07-24 DIAGNOSIS — I129 Hypertensive chronic kidney disease with stage 1 through stage 4 chronic kidney disease, or unspecified chronic kidney disease: Secondary | ICD-10-CM | POA: Insufficient documentation

## 2019-07-24 DIAGNOSIS — N390 Urinary tract infection, site not specified: Secondary | ICD-10-CM | POA: Insufficient documentation

## 2019-07-24 DIAGNOSIS — M795 Residual foreign body in soft tissue: Secondary | ICD-10-CM

## 2019-07-24 LAB — CBC WITH DIFFERENTIAL/PLATELET
Abs Immature Granulocytes: 0.04 10*3/uL (ref 0.00–0.07)
Basophils Absolute: 0 10*3/uL (ref 0.0–0.1)
Basophils Relative: 1 %
Eosinophils Absolute: 0.5 10*3/uL (ref 0.0–0.5)
Eosinophils Relative: 8 %
HCT: 37.5 % — ABNORMAL LOW (ref 39.0–52.0)
Hemoglobin: 11.9 g/dL — ABNORMAL LOW (ref 13.0–17.0)
Immature Granulocytes: 1 %
Lymphocytes Relative: 16 %
Lymphs Abs: 1.1 10*3/uL (ref 0.7–4.0)
MCH: 28.5 pg (ref 26.0–34.0)
MCHC: 31.7 g/dL (ref 30.0–36.0)
MCV: 89.9 fL (ref 80.0–100.0)
Monocytes Absolute: 0.6 10*3/uL (ref 0.1–1.0)
Monocytes Relative: 9 %
Neutro Abs: 4.5 10*3/uL (ref 1.7–7.7)
Neutrophils Relative %: 65 %
Platelets: 124 10*3/uL — ABNORMAL LOW (ref 150–400)
RBC: 4.17 MIL/uL — ABNORMAL LOW (ref 4.22–5.81)
RDW: 15.1 % (ref 11.5–15.5)
WBC: 6.8 10*3/uL (ref 4.0–10.5)
nRBC: 0 % (ref 0.0–0.2)

## 2019-07-24 LAB — BASIC METABOLIC PANEL
Anion gap: 11 (ref 5–15)
BUN: 36 mg/dL — ABNORMAL HIGH (ref 8–23)
CO2: 25 mmol/L (ref 22–32)
Calcium: 9.2 mg/dL (ref 8.9–10.3)
Chloride: 101 mmol/L (ref 98–111)
Creatinine, Ser: 1.76 mg/dL — ABNORMAL HIGH (ref 0.61–1.24)
GFR calc Af Amer: 38 mL/min — ABNORMAL LOW (ref >60)
GFR calc non Af Amer: 32 mL/min — ABNORMAL LOW (ref >60)
Glucose, Bld: 151 mg/dL — ABNORMAL HIGH (ref 70–99)
Potassium: 3.8 mmol/L (ref 3.5–5.1)
Sodium: 137 mmol/L (ref 135–145)

## 2019-07-24 LAB — URINALYSIS, COMPLETE (UACMP) WITH MICROSCOPIC
Bilirubin Urine: NEGATIVE
Glucose, UA: NEGATIVE mg/dL
Hgb urine dipstick: NEGATIVE
Ketones, ur: NEGATIVE mg/dL
Nitrite: NEGATIVE
Protein, ur: NEGATIVE mg/dL
Specific Gravity, Urine: 1.011 (ref 1.005–1.030)
pH: 5 (ref 5.0–8.0)

## 2019-07-24 MED ORDER — CEPHALEXIN 500 MG PO CAPS
500.0000 mg | ORAL_CAPSULE | Freq: Once | ORAL | Status: AC
  Administered 2019-07-24: 21:00:00 500 mg via ORAL
  Filled 2019-07-24: qty 1

## 2019-07-24 MED ORDER — CEPHALEXIN 500 MG PO CAPS: 500.0000 mg | ORAL_CAPSULE | Freq: Two times a day (BID) | ORAL | 0 refills | Status: AC

## 2019-07-24 NOTE — Discharge Instructions (Signed)
Please take the Keflex 1 pill twice a day.  This should help treat your UTI.  Please follow-up with Dr. Bernardo Heater your urologist.  He will further evaluate your urine sphincter apparatus.  If you develop any rash or itching or if you have loss of the ability to produce urine again please return here.

## 2019-07-24 NOTE — Telephone Encounter (Signed)
Pt called and states that he can not urinate and he has pain in his scrotum. I advised him go to the ER.

## 2019-07-24 NOTE — ED Notes (Signed)
Bladder scan results: 255 ml

## 2019-07-24 NOTE — ED Provider Notes (Signed)
[facility] Emergency Department Provider Note   ____________________________________________   First MD Initiated Contact with Patient 07/24/19 1919     (approximate)  I have reviewed the triage vital signs and the nursing notes.   HISTORY  Chief Complaint Urinary Retention    HP SEON GAERTNER is a 85 y.o. male who comes in complaining of a foreign body in his scrotum.  He reports he had a urinary sphincter placed stopped functioning about 3 months ago.  Review of the old records seems indicated good candidate off.  At any rate he is has been dribbling urine for 3 months.  Today he went to urinate and then he could not and he did stop dribbling.  However now in the emergency room he is dribbling again.  Bladder scan after urine began dribbling showed about 150 to 200 cc in the bladder.  Patient is not having any discomfort.  There is a foreign body type of object about 3 to 4 cm long and a half a centimeter wide that is palpable in the scrotum.  Is not tender there is no redness or swelling.  Discussed this with Dr. Bernardo Heater who is on-call.  Dr. Lorenza Chick feels it is best to let the patient dribble and not put a Foley in.  He will follow the patient up in the office and perform further evaluation there.         Past Medical History:  Diagnosis Date  . Arthritis   . Gout   . High blood pressure   . Prostate cancer ([place])   . Urinary retention     Patient Active Problem List   Diagnosis Date Noted  . Anemia of chronic disease 11/19/2018  . Goals of care, counseling/discussion 08/26/2018  . Melanoma of skin ([place]) 08/26/2018  . Personal history of prostate cancer 10/08/2017  . Stress incontinence of urine 10/08/2017  . Chronic kidney disease, stage 4, severely decreased GFR (HCC) 05/20/2016  . Hyperlipidemia 05/20/2016  . Lumbar radiculitis 09/24/2014  . Bilateral knee pain 08/03/2014  . Dementia ([place]) 06/07/2014  . History of coronary artery disease  06/07/2014  . History of lymphoma 09/07/2012  . Coronary artery disease 07/21/2012  . Essential (primary) hypertension 07/21/2012  . Pure hypercholesterolemia 07/21/2012    Past Surgical History:  Procedure Laterality Date  . PROSTATECTOMY  1999  . TOTAL KNEE ARTHROPLASTY  2005-2008   x2  . URINARY SPHINCTER IMPLANT      Prior to Admission medications   Medication Sig Start Date End Date Taking? Authorizing Provider  atorvastatin (LIPITOR) 80 MG tablet Take 80 mg by mouth daily at 6 PM.  04/20/16   [provider]  ciprofloxacin (CIPRO) 500 MG tablet Take 1 tablet (500 mg total) by mouth every 12 (twelve) hours. 12/25/18   Billey Co, MD  donepezil (ARICEPT) 10 MG tablet Take 10 mg by mouth at bedtime.  05/03/16   [provider]  furosemide (LASIX) 80 MG tablet Take 40 mg by mouth 2 (two) times daily as needed.  02/17/16   [provider]  hyoscyamine (LEVSIN SL) 0.125 MG SL tablet dissolve 1 to 2 tablets UNDER THE TONGUE every 4 hours if needed . DO NOT TAKE MORE THAN 12 TABLETS IN 24 HOURS 04/10/16   [provider]  losartan (COZAAR) 50 MG tablet Take 50 mg by mouth daily. 08/06/18   [provider]  memantine (NAMENDA) 10 MG tablet Take 10 mg by mouth 2 (two) times  daily.  04/20/16   [provider]  Meth-Hyo-M Bl-Na Phos-Ph Sal (URIBEL) 118 MG CAPS Take 1 capsule (118 mg total) by mouth daily as needed (up to 4 times a day). 03/09/19   Stoioff, Ronda Fairly, MD  metoprolol succinate (TOPROL-XL) 25 MG 24 hr tablet Take 25 mg by mouth daily.  05/01/16   [provider]  nystatin (MYCOSTATIN/NYSTOP) powder Apply topically 4 (four) times daily. 11/10/18   Billey Co, MD  potassium chloride (K-DUR) 10 MEQ tablet Take 10 mEq by mouth daily. 08/06/18   [provider]  torsemide (DEMADEX) 20 MG tablet Take 40 mg by mouth daily. 08/06/18   [provider]  triamcinolone ointment (KENALOG) 0.5 % Apply 1 application  topically 2 (two) times daily. 12/25/18   Billey Co, MD    Allergies Hydrocodone and Penicillins  Family History  Problem Relation Age of Onset  . Bladder Cancer Mother 37  . Kidney cancer Father 85  . Heart attack Father   . Heart Problems Sister     Social History Social History   Tobacco Use  . Smoking status: Never Smoker  . Smokeless tobacco: Never Used  Substance Use Topics  . Alcohol use: Never    Frequency: Never  . Drug use: Never    Review of Systems  Constitutional: No fever/chills Eyes: No visual changes. ENT: No sore throat. Cardiovascular: Denies chest pain. Respiratory: Denies shortness of breath. Gastrointestinal: No abdominal pain.  No nausea, no vomiting.  No diarrhea.  No constipation. Genitourinary: Negative for dysuria. Musculoskeletal: Negative for back pain. Skin: Negative for rash. Neurological: Negative for headaches, focal weakness   ____________________________________________   PHYSICAL EXAM:  VITAL SIGNS: ED Triage Vitals  Enc Vitals Group     BP 07/24/19 1654 126/82     Pulse Rate 07/24/19 1654 92     Resp 07/24/19 1654 20     Temp 07/24/19 1654 98.9 F (37.2 C)     Temp Source 07/24/19 1919 Oral     SpO2 07/24/19 1654 97 %     Weight 07/24/19 1656 195 lb (88.5 kg)     Height 07/24/19 1656 5\' 10"  (1.778 m)     Head Circumference --      Peak Flow --      Pain Score 07/24/19 1655 4     Pain Loc --      Pain Edu? --      Excl. in [place]? --     Constitutional: Alert and oriented. Well appearing and in no acute distress. Eyes: Conjunctivae are normal Head: Atraumatic. Nose: No congestion/rhinnorhea. Mouth/Throat: Mucous membranes are moist.  Oropharynx non-erythematous. Neck: No stridor Cardiovascular: Kermit Balo peripheral circulation. Respiratory: Normal respiratory effort.  No retractions.  Gastrointestinal: Soft and nontender. No distention. No abdominal bruits.  Genitourinary: Patient is now dribbling urine.  The  scrotum looks normal but does have a firm mass as described in HPI palpable.  Is not tender.  There is no redness or swelling. Musculoskeletal: No lower extremity tenderness with bilateral edema worse on the right this is old. Neurologic:  Normal speech and language. No gross focal neurologic deficits are appreciated. No gait instability.    ____________________________________________   LABS (all labs ordered are listed, but only abnormal results are displayed)  Labs Reviewed  URINALYSIS, COMPLETE (UACMP) WITH MICROSCOPIC - Abnormal; Notable for the following components:      Result Value   Color, Urine BLUE (*)    APPearance HAZY (*)  Leukocytes,Ua MODERATE (*)    Bacteria, UA RARE (*)    All other components within normal limits  BASIC METABOLIC PANEL - Abnormal; Notable for the following components:   Glucose, Bld 151 (*)    BUN 36 (*)    Creatinine, Ser 1.76 (*)    GFR calc non Af Amer 32 (*)    GFR calc Af Amer 38 (*)    All other components within normal limits  CBC WITH DIFFERENTIAL/PLATELET - Abnormal; Notable for the following components:   RBC 4.17 (*)    Hemoglobin 11.9 (*)    HCT 37.5 (*)    Platelets 124 (*)    All other components within normal limits   ____________________________________________  EKG   ____________________________________________  RADIOLOGY  ED MD interpretation: X-rays read as foreign body in the scrotum I have reviewed the films and agree.  This does appear to be the patient's sphincter apparatus.  Additionally there is some tubing visible on the KUB and pelvis x-ray  Official radiology report(s): Dg Pelvis 1-2 Views  Result Date: 07/24/2019 CLINICAL DATA:  Palpable scrotal abnormality, unable to urinate EXAM: PELVIS - 1-2 VIEW COMPARISON:  None. FINDINGS: The osseous structures appear diffusely demineralized which may limit detection of small or nondisplaced fractures. Degenerative changes in the lower lumbar spine, SI joints and  both hips. Extensive vascular calcification is present. Numerous surgical clips overlying the deep pelvis likely reflect prior prostatectomy and sidewall dissection. Overlying the region of the scrotum is a small metallic foreign body seen on supine image 1 of 2. This may correspond to the palpable abnormality by the referring physician and could reflect portion of the patient's reported sphincter implant. IMPRESSION: Overlying the region of the scrotum is a small metallic foreign body seen on supine image 1 of 2. This may correspond to the palpable abnormality by the referring physician and reflect hardware from patient's prior sphincter implant. Electronically Signed   By: Lovena Le M.D.   On: 07/24/2019 19:59   US Scrotum W/doppler  Result Date: 07/24/2019 CLINICAL DATA:  Lump in the testicle. EXAM: SCROTAL ULTRASOUND DOPPLER ULTRASOUND OF THE TESTICLES TECHNIQUE: Complete ultrasound examination of the testicles, epididymis, and other scrotal structures was performed. Color and spectral Doppler ultrasound were also utilized to evaluate blood flow to the testicles. COMPARISON:  None. FINDINGS: Right testicle Measurements: 2.4 x 2 x 2.4 cm. No mass or microlithiasis visualized. Left testicle Measurements: 2.3 x 1.3 x 2.4 cm. No mass or microlithiasis visualized. Right epididymis:  Normal in size and appearance. Left epididymis:  Normal in size and appearance. Hydrocele:  None visualized. Varicocele:  None visualized. Pulsed Doppler interrogation of both testes demonstrates normal low resistance arterial and venous waveforms bilaterally. Incidental note made of a urinary sphincter prosthesis eccentric towards the right scrotum. IMPRESSION: 1. No testicular torsion. 2. No testicular mass. Electronically Signed   By: Kathreen Devoid   On: 07/24/2019 19:13    ____________________________________________   PROCEDURES  Procedure(s) performed (including Critical Care):  Procedures    ____________________________________________   INITIAL IMPRESSION / ASSESSMENT AND PLAN / ED COURSE  Treat the patient's UTI and have him follow-up with Dr. Bernardo Heater he will return for any further problems.             ____________________________________________   FINAL CLINICAL IMPRESSION(S) / ED DIAGNOSES  Final diagnoses:  Lump in the testicle  Unable to void  Foreign body (FB) in soft tissue     ED Discharge Orders  None       Note:  This document was prepared using Dragon voice recognition software and may include unintentional dictation errors.    Nena Polio, MD 07/24/19 2103

## 2019-07-24 NOTE — ED Notes (Signed)
ED Provider Malinda  at bedside. 

## 2019-07-24 NOTE — ED Notes (Signed)
Patient transported to X-ray 

## 2019-07-24 NOTE — ED Notes (Signed)
Pt provided with water per pt's request 

## 2019-07-24 NOTE — ED Notes (Signed)
In and Out catheter performed with Judson Roch EDT present as a witness.

## 2019-07-24 NOTE — ED Notes (Signed)
Patient denies pain. Wife at bedside at this time.

## 2019-07-24 NOTE — ED Triage Notes (Signed)
Pt wife present, she reports that pt had sphincter implant that "wore out" 3 months ago, and not he states that he has been unable to urinate 1400. Pt states he normally urinating very frequently. Pt also states felt a lump in scrotum for the first time today when taking a shower, and has been unable to urinate since. NAD noted at this time.

## 2019-07-27 NOTE — Telephone Encounter (Signed)
Left message for patient to call back   Sharyn Lull

## 2019-07-27 NOTE — Telephone Encounter (Signed)
-----   Message from Abbie Sons, MD sent at 07/26/2019 12:46 PM EDT ----- Regarding: Follow-up He was seen in the ED over the weekend regarding incontinence and "problems with his urinary sphincter".  He saw Dr. Terance Hart at [facility] in February regarding this problem.  Would recommend a follow-up at [facility] or with Dr. Matilde Sprang

## 2019-07-28 NOTE — Telephone Encounter (Signed)
Spoke with patient's wife and she will contact Dr. Elam Dutch office to make an app for follow up   Sharyn Lull

## 2019-09-14 IMAGING — CT NM PET TUM IMG INITIAL (PI) WHOLE BODY
10 series · 25 of 25 positions shown · non-contrast
Comparison: None.

CLINICAL DATA: Subsequent treatment strategy for melanoma.. RIGHT
lower extremity melanoma diagnosed 6152. Concern for recurrence in
the RIGHT lower extremity with punch biopsy.

EXAM:
NUCLEAR MEDICINE PET WHOLE BODY
TECHNIQUE: 11.0 mCi F-18 FDG was injected intravenously. Full-ring PET imaging
was performed from the skull base to thigh after the radiotracer. CT
data was obtained and used for attenuation correction and anatomic
localization.
Fasting blood glucose: 90 mg/dl

[Series 3: ct wb 5.0 b30f · axial · 5.0mm · 0.98mm/px · z∈[-1612,+194]mm · 4 of 603 slices shown]
[im 1/603  soft-tissue]
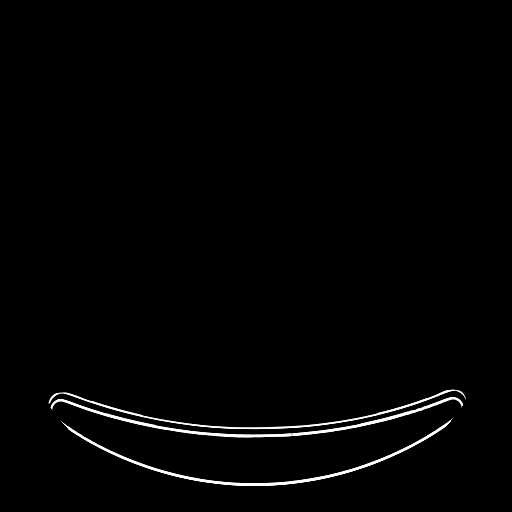
[im 201/603  soft-tissue]
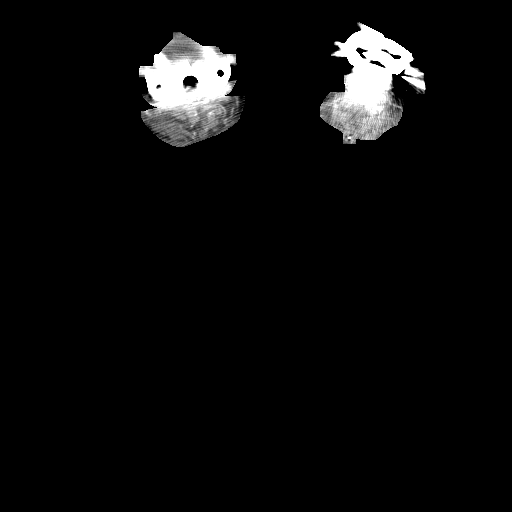
[im 402/603  soft-tissue]
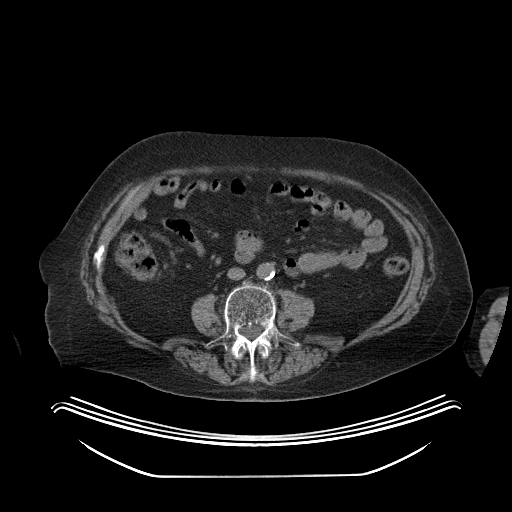
[im 603/603  soft-tissue]
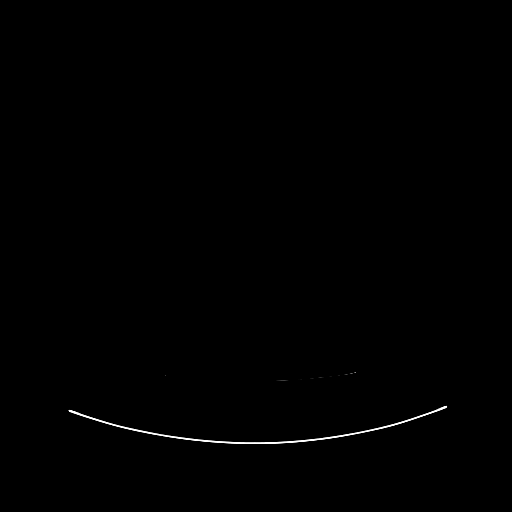

[Series 4: pet wb (ac) · axial · 5.0mm · 4.07mm/px · z∈[-1612,+194]mm · 4 of 603 slices shown]
[im 1/603]
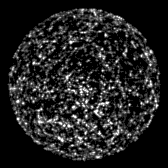
[im 201/603]
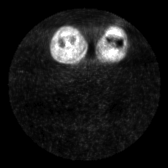
[im 402/603]
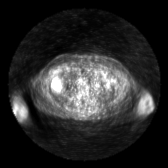
[im 603/603]
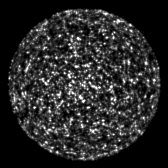

[Series 5: pet wb uncorrected (nac) · axial · 5.0mm · 4.07mm/px · z∈[-1612,+194]mm · 4 of 603 slices shown]
[im 1/603]
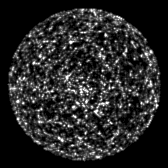
[im 201/603]
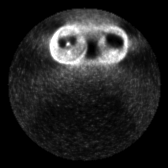
[im 402/603]
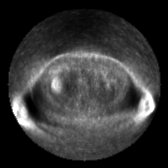
[im 603/603]
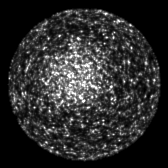

[Series 603: pet fused axial · 4 of 593 slices shown]
[im 1/593]
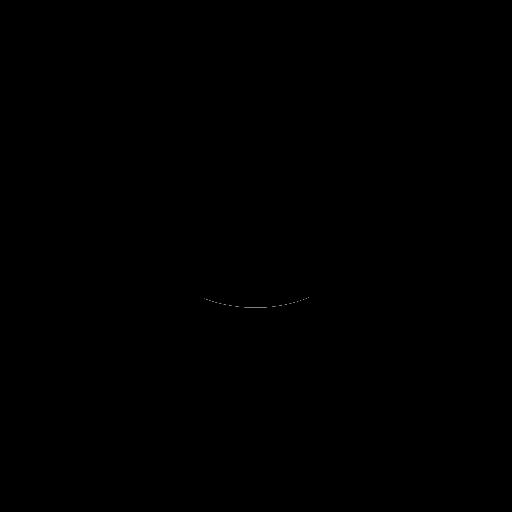
[im 198/593]
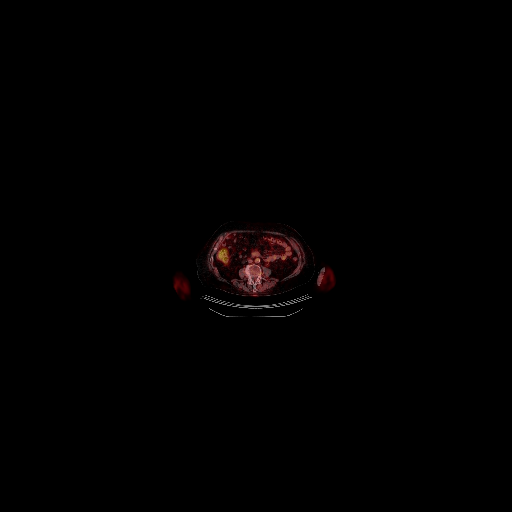
[im 395/593]
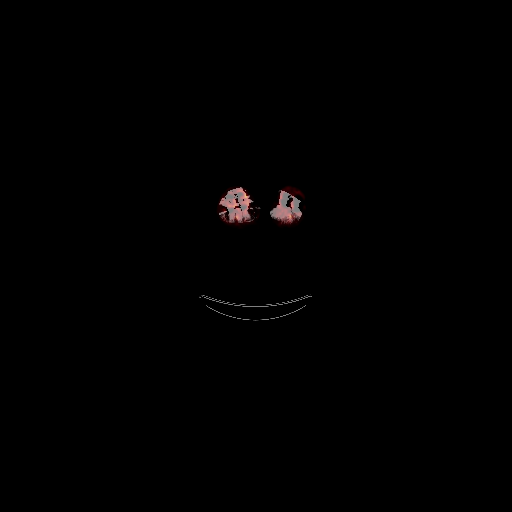
[im 593/593]
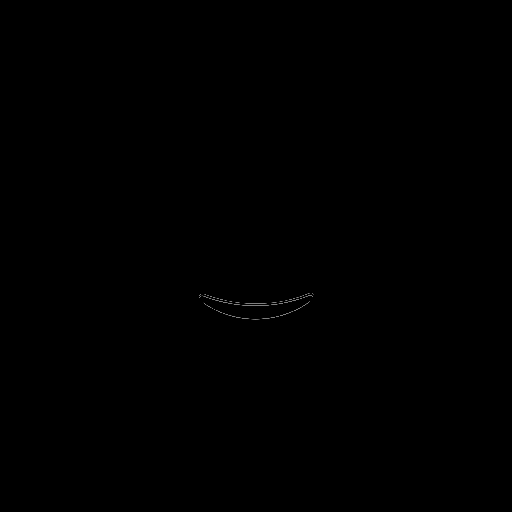

[Series 604: pet fused coronal · 1 of 111 slices shown]
[im 1/111]
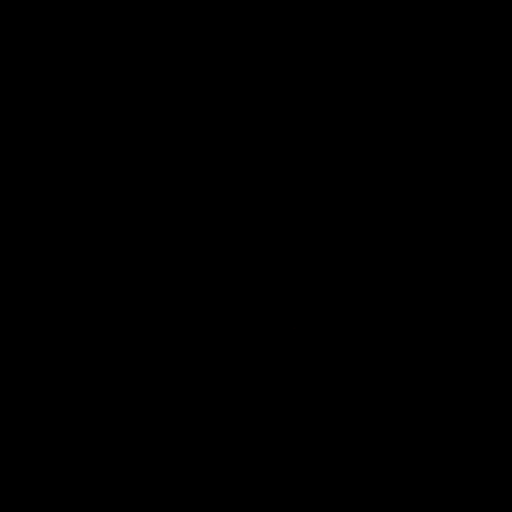

[Series 605: pet fused sagittal · 1 of 190 slices shown]
[im 1/190]
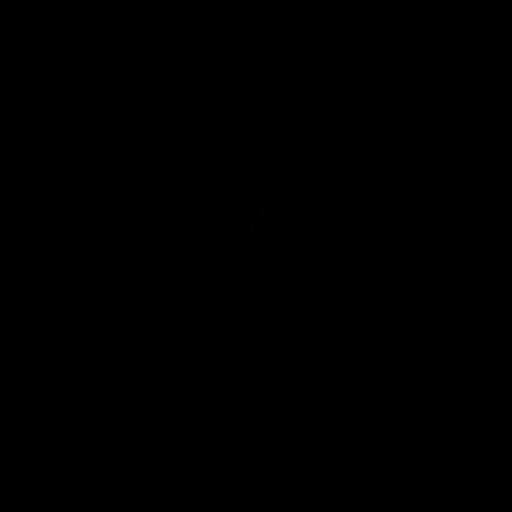

[Series 606: pet axial · 4 of 585 slices shown]
[im 1/585]
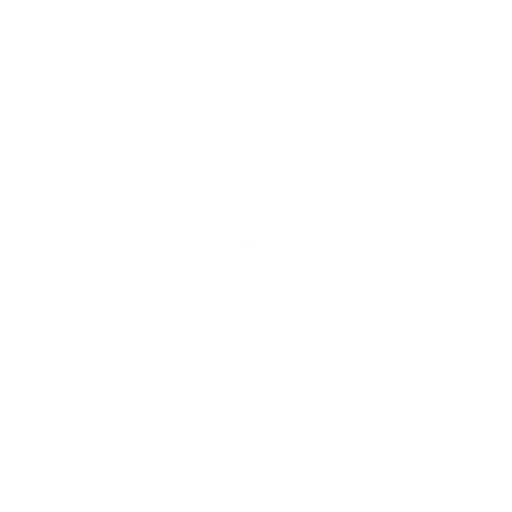
[im 195/585]
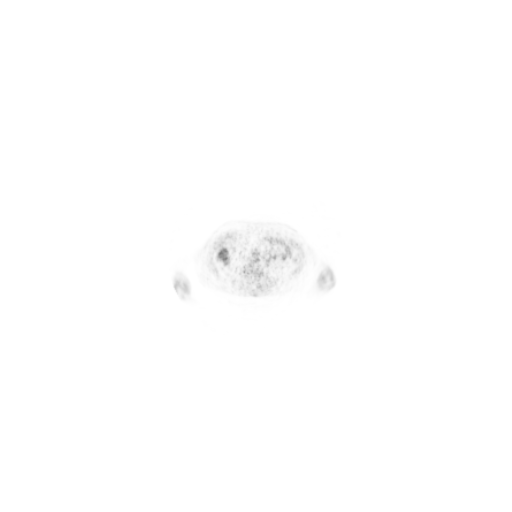
[im 390/585]
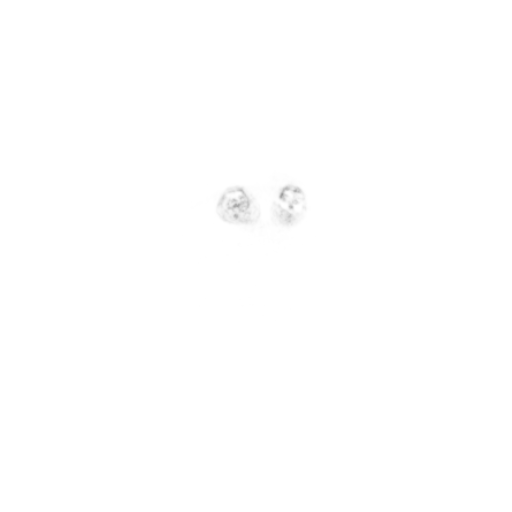
[im 585/585]
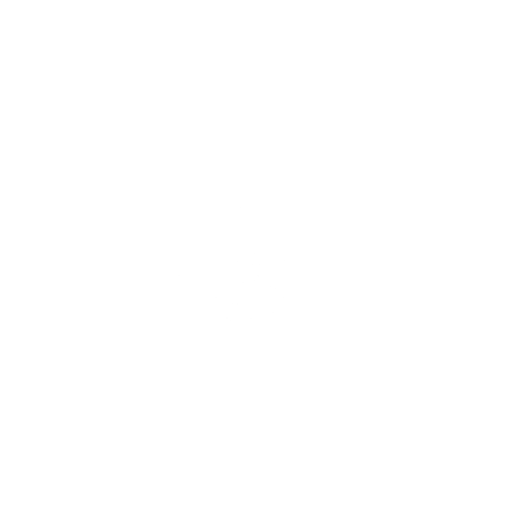

[Series 607: pet coronal · 1 of 173 slices shown]
[im 1/173]
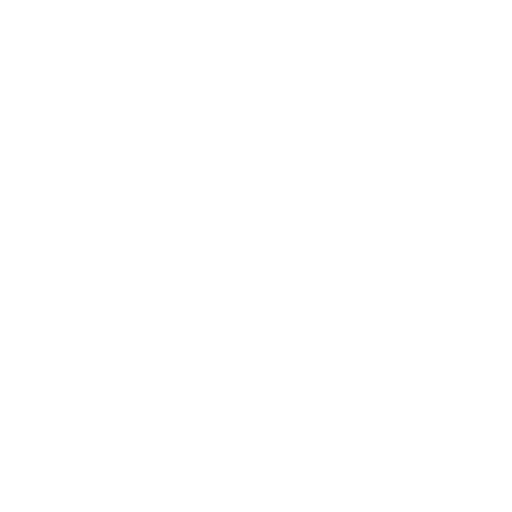

[Series 608: pet sagittal · 1 of 204 slices shown]
[im 1/204]
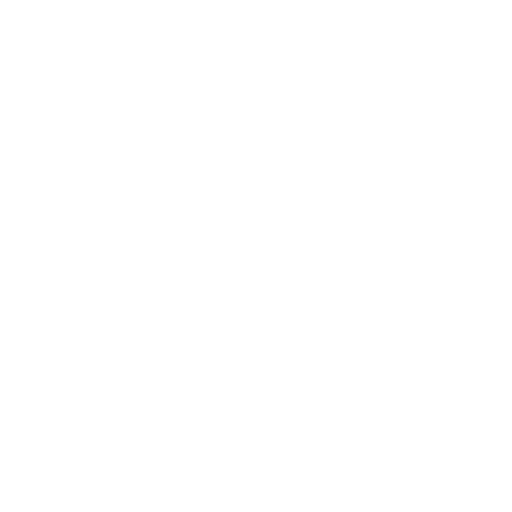

[Series 1069: results mm oncology reading · 1.0mm · 0.87mm/px · 1 of 3 slices shown]
[im 1/3]
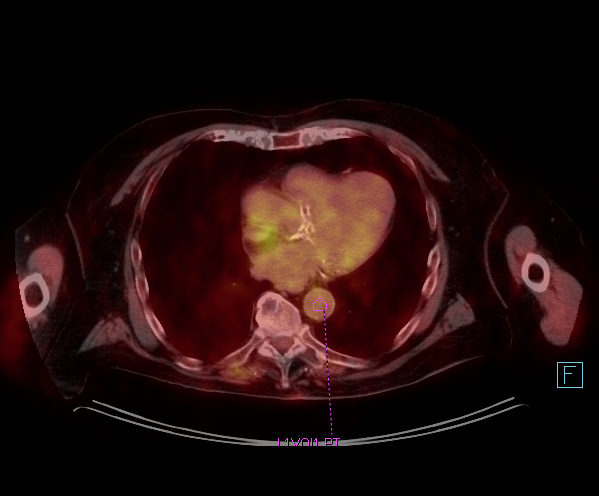

[25 of 25 positions shown; findings below may reference images not displayed]

FINDINGS: Mediastinal blood pool activity: SUV max

HEAD/NECK: No hypermetabolic activity in the scalp. No
hypermetabolic cervical lymph nodes.

Incidental CT findings: none

CHEST: No hypermetabolic mediastinal or hilar nodes. No suspicious
pulmonary nodules on the CT scan.

Incidental CT findings: Coronary artery calcification and aortic
atherosclerotic calcification.

ABDOMEN/PELVIS: No abnormal hypermetabolic activity within the
liver, pancreas, adrenal glands, or spleen. No hypermetabolic lymph
nodes in the abdomen or pelvis.

Incidental CT findings: Penile prosthetic.  Post prostatectomy

SKELETON: No focal hypermetabolic activity to suggest skeletal
metastasis.

Incidental CT findings: Degenerate change of the lower thoracic
spine.

EXTREMITIES: No abnormal hypermetabolic activity in the lower
extremities.

Incidental CT findings: There is subcutaneous edema within LEFT and
RIGHT extremity. There is enlargement of the RIGHT lower extremity
consistent with edema.

Intense activity localizing to the posterior aspect of the RIGHT
calcaneus with a sclerotic linear line paralleling the posterior
surface is favored a remote fracture.
IMPRESSION: 1. No evidence cutaneous melanoma with close attention directed to
the lower extremities.
2. No evidence of metastatic melanoma on whole-body FDG by scan.
3. Edema and cellulitis within the RIGHT lower extremity.

4. Intense activity within the posterior RIGHT calcaneus favored
relate to remote trauma.

## 2019-09-23 ENCOUNTER — Other Ambulatory Visit: Payer: Self-pay | Admitting: Urology

## 2019-09-23 ENCOUNTER — Other Ambulatory Visit: Payer: Self-pay

## 2019-09-23 ENCOUNTER — Ambulatory Visit (INDEPENDENT_AMBULATORY_CARE_PROVIDER_SITE_OTHER): Payer: Medicare Other | Admitting: Physician Assistant

## 2019-09-23 ENCOUNTER — Telehealth: Payer: Self-pay | Admitting: Urology

## 2019-09-23 VITALS — BP 142/78 | HR 92 | Ht 71.0 in | Wt 195.0 lb

## 2019-09-23 DIAGNOSIS — T83718A Erosion of other implanted mesh and other prosthetic materials to surrounding organ or tissue, initial encounter: Secondary | ICD-10-CM | POA: Diagnosis not present

## 2019-09-23 MED ORDER — SULFAMETHOXAZOLE-TRIMETHOPRIM 800-160 MG PO TABS: 1.0000 | ORAL_TABLET | Freq: Two times a day (BID) | ORAL | 0 refills | Status: DC

## 2019-09-23 NOTE — Telephone Encounter (Signed)
ERROR

## 2019-09-24 ENCOUNTER — Telehealth: Payer: Self-pay | Admitting: Urology

## 2019-09-24 ENCOUNTER — Other Ambulatory Visit: Payer: Self-pay | Admitting: Radiology

## 2019-09-24 ENCOUNTER — Other Ambulatory Visit

## 2019-09-24 ENCOUNTER — Encounter
Admission: RE | Admit: 2019-09-24 | Discharge: 2019-09-24 | Disposition: A | Discharge status: Home / Self Care | Source: Ambulatory Visit | Attending: Urology | Admitting: Urology

## 2019-09-24 DIAGNOSIS — I10 Essential (primary) hypertension: Secondary | ICD-10-CM | POA: Diagnosis not present

## 2019-09-24 DIAGNOSIS — I440 Atrioventricular block, first degree: Secondary | ICD-10-CM | POA: Diagnosis not present

## 2019-09-24 DIAGNOSIS — Z01818 Encounter for other preprocedural examination: Secondary | ICD-10-CM | POA: Insufficient documentation

## 2019-09-24 DIAGNOSIS — Z20828 Contact with and (suspected) exposure to other viral communicable diseases: Secondary | ICD-10-CM | POA: Diagnosis not present

## 2019-09-24 DIAGNOSIS — T83718A Erosion of other implanted mesh and other prosthetic materials to surrounding organ or tissue, initial encounter: Secondary | ICD-10-CM

## 2019-09-24 HISTORY — DX: Personal history of urinary calculi: Z87.442

## 2019-09-24 HISTORY — DX: Cardiac murmur, unspecified: R01.1

## 2019-09-24 LAB — CBC
HCT: 38.6 % — ABNORMAL LOW (ref 39.0–52.0)
Hemoglobin: 12 g/dL — ABNORMAL LOW (ref 13.0–17.0)
MCH: 27.6 pg (ref 26.0–34.0)
MCHC: 31.1 g/dL (ref 30.0–36.0)
MCV: 88.9 fL (ref 80.0–100.0)
Platelets: 171 10*3/uL (ref 150–400)
RBC: 4.34 MIL/uL (ref 4.22–5.81)
RDW: 15.3 % (ref 11.5–15.5)
WBC: 7.8 10*3/uL (ref 4.0–10.5)
nRBC: 0 % (ref 0.0–0.2)

## 2019-09-24 LAB — POTASSIUM: Potassium: 4.4 mmol/L (ref 3.5–5.1)

## 2019-09-24 LAB — SARS CORONAVIRUS 2 (TAT 6-24 HRS): SARS Coronavirus 2: NEGATIVE

## 2019-09-24 NOTE — Progress Notes (Signed)
09/23/2019 8:33 AM   Alexander Hogan Jun 03, 1922 [id number]  CC: Eroded artificial sphincter  HPI: Alexander Hogan is a 85 y.o. male who presents today for evaluation of possible erosion of his artificial sphincter through his scrotum.  PMH significant for prostate cancer s/p radical prostatectomy in 1999.  He experienced post prostatectomy incontinence and subsequently underwent placement of an artificial urinary sphincter approximately 12 years ago, per patient.  This was replaced in 2012 and subsequently revised.  He has had numerous office and ED visits over the past year for difficulties associated with the sphincter.  Notes from [facility] state that he has had difficulty activating and deactivating the device with the recommendation to leave it deactivated and allow him to leak in order to prevent an episode of urinary retention.  Today, he reports noticing a component of his artificial sphincter protruding through his scrotal tissue.  He states he first noticed this "recently," but is not more specific.  Recent medical history significant for metastatic melanoma, in hospice.  PMH: Past Medical History:  Diagnosis Date  . Arthritis   . Gout   . High blood pressure   . Prostate cancer ([place])   . Urinary retention     Surgical History: Past Surgical History:  Procedure Laterality Date  . PROSTATECTOMY  1999  . TOTAL KNEE ARTHROPLASTY  2005-2008   x2  . URINARY SPHINCTER IMPLANT      Home Medications:  Allergies as of 09/23/2019      Reactions   Hydrocodone Other (See Comments)   confusion   Penicillins Rash      Medication List       Accurate as of September 23, 2019 11:59 PM. If you have any questions, ask your nurse or doctor.        atorvastatin 80 MG tablet Commonly known as: LIPITOR Take 80 mg by mouth daily at 6 PM.   ciprofloxacin 500 MG tablet Commonly known as: CIPRO Take 1 tablet (500 mg total) by mouth every 12 (twelve) hours.   donepezil 10 MG  tablet Commonly known as: ARICEPT Take 10 mg by mouth at bedtime.   furosemide 80 MG tablet Commonly known as: LASIX Take 40 mg by mouth 2 (two) times daily as needed.   hyoscyamine 0.125 MG SL tablet Commonly known as: LEVSIN SL dissolve 1 to 2 tablets UNDER THE TONGUE every 4 hours if needed . DO NOT TAKE MORE THAN 12 TABLETS IN 24 HOURS   losartan 50 MG tablet Commonly known as: COZAAR Take 50 mg by mouth daily.   memantine 10 MG tablet Commonly known as: NAMENDA Take 10 mg by mouth 2 (two) times daily.   metoprolol succinate 25 MG 24 hr tablet Commonly known as: TOPROL-XL Take 25 mg by mouth daily.   nystatin powder Commonly known as: MYCOSTATIN/NYSTOP Apply topically 4 (four) times daily.   potassium chloride 10 MEQ tablet Commonly known as: KLOR-CON Take 10 mEq by mouth daily.   sulfamethoxazole-trimethoprim 800-160 MG tablet Commonly known as: BACTRIM DS Take 1 tablet by mouth every 12 (twelve) hours. Started by: Zara Council, PA-C   torsemide 20 MG tablet Commonly known as: DEMADEX Take 40 mg by mouth daily.   triamcinolone ointment 0.5 % Commonly known as: KENALOG Apply 1 application topically 2 (two) times daily.   Uribel 118 MG Caps Take 1 capsule (118 mg total) by mouth daily as needed (up to 4 times a day).       Allergies:  Allergies  Allergen Reactions  . Hydrocodone Other (See Comments)    confusion  . Penicillins Rash    Family History: Family History  Problem Relation Age of Onset  . Bladder Cancer Mother 25  . Kidney cancer Father 25  . Heart attack Father   . Heart Problems Sister     Social History:   reports that he has never smoked. He has never used smokeless tobacco. He reports that he does not drink alcohol or use drugs.  ROS: UROLOGY Frequent Urination?: No Hard to postpone urination?: No Burning/pain with urination?: Yes Get up at night to urinate?: No Leakage of urine?: No Urine stream starts and stops?: No  Trouble starting stream?: No Do you have to strain to urinate?: No Blood in urine?: No Urinary tract infection?: No Sexually transmitted disease?: No Injury to kidneys or bladder?: No Painful intercourse?: No Weak stream?: No Erection problems?: No Penile pain?: No  Gastrointestinal Nausea?: No Vomiting?: No Indigestion/heartburn?: No Diarrhea?: No Constipation?: No  Constitutional Fever: No Night sweats?: No Weight loss?: No Fatigue?: No  Skin Skin rash/lesions?: No Itching?: No  Eyes Blurred vision?: No Double vision?: No  Ears/Nose/Throat Sore throat?: No Sinus problems?: No  Hematologic/Lymphatic Swollen glands?: No Easy bruising?: No  Cardiovascular Leg swelling?: No Chest pain?: No  Respiratory Cough?: No Shortness of breath?: No  Endocrine Excessive thirst?: No  Musculoskeletal Back pain?: No Joint pain?: No  Neurological Headaches?: No Dizziness?: No  Psychologic Depression?: No Anxiety?: No  Physical Exam: BP (!) 142/78   Pulse 92   Ht 5\' 11"  (1.803 m)   Wt 195 lb (88.5 kg)   BMI 27.20 kg/m   Constitutional:  Alert and oriented, no acute distress, nontoxic appearing HEENT: [place], AT Cardiovascular: No clubbing, cyanosis. 4+ pitting edema of the BLEs. Respiratory: Normal respiratory effort, no increased work of breathing GU: Uncircumcised penis, approximate 2cm protrusion of a silicone-capped sphincter component from the posteriolateral right scrotum without surrounding erythema or drainage Skin: Peau d'orange appearance of the BLEs R>L, scattered black skin lesions of the bilateral shins Neurologic: Grossly intact, no focal deficits, moving all 4 extremities Psychiatric: Normal mood and affect  Assessment & Plan:   1. Erosion of genitourinary device to surrounding tissue, initial encounter [facility]) Patient with apparent erosion of his artificial sphincter pump through his posteriolateral right scrotum.  Patient is well-appearing  with no local signs of infection.  Dr. Erlene Quan spoke with both his wife and daughter via telephone today to discuss his surgical options.  She explained that his options include surgical removal of the artificial sphincter or electing for no intervention given his status as a hospice patient with metastatic melanoma.  She explained that electing for no intervention would lead to inevitable infection which would likely spread to his bloodstream and become fatal.  Patient and family have decided to discuss this overnight and will call us in the morning to inform us of their decision on how to proceed.  Starting patient on 2 weeks of Bactrim DS twice daily today for infection prevention in the interim.  Debroah Loop, PA-C  [facility] [address], [place], [place] [address] [phone]

## 2019-09-24 NOTE — Patient Instructions (Signed)
Your procedure is scheduled on: 09-28-19 Report to Same Day Surgery 2nd floor medical mall [facility] Entrance-take elevator on left to 2nd floor.  Check in with surgery information desk.) To find out your arrival time please call [phone] between 1PM - 3PM on 09-25-19 FRIDAY  Remember: Instructions that are not followed completely may result in serious medical risk, up to and including death, or upon the discretion of your surgeon and anesthesiologist your surgery may need to be rescheduled.    _x___ 1. Do not eat food after midnight the night before your procedure. NO GUM OR CANDY AFTER MIDNIGHT. You may drink clear liquids up to 2 hours before you are scheduled to arrive at the hospital for your procedure.  Do not drink clear liquids within 2 hours of your scheduled arrival to the hospital.  Clear liquids include  --Water or Apple juice without pulp  --Clear carbohydrate beverage such as ClearFast or Gatorade  --Black Coffee or Clear Tea (No milk, no creamers, do not add anything to the coffee or Tea   ____Ensure clear carbohydrate drink on the way to the hospital for bariatric patients  ____Ensure clear carbohydrate drink 3 hours before surgery.     __x__ 2. No Alcohol for 24 hours before or after surgery.   __x__3. No Smoking or e-cigarettes for 24 prior to surgery.  Do not use any chewable tobacco products for at least 6 hour prior to surgery   ____  4. Bring all medications with you on the day of surgery if instructed.    __x__ 5. Notify your doctor if there is any change in your medical condition     (cold, fever, infections).    x___6. On the morning of surgery brush your teeth with toothpaste and water.  You may rinse your mouth with mouth wash if you wish.  Do not swallow any toothpaste or mouthwash.   Do not wear jewelry, make-up, hairpins, clips or nail polish.  Do not wear lotions, powders, or perfumes. You may wear deodorant.  Do not shave 48 hours prior to  surgery. Men may shave face and neck.  Do not bring valuables to the hospital.    [facility] is not responsible for any belongings or valuables.               Contacts, dentures or bridgework may not be worn into surgery.  Leave your suitcase in the car. After surgery it may be brought to your room.  For patients admitted to the hospital, discharge time is determined by your treatment team.  _  Patients discharged the day of surgery will not be allowed to drive home.  You will need someone to drive you home and stay with you the night of your procedure.    Please read over the following fact sheets that you were given:   [facility] Preparing for Surgery    _x___ TAKE THE FOLLOWING MEDICATION THE MORNING OF SURGERY WITH A SMALL SIP OF WATER. These include:  1.   2.  3.  4.  5.  6.  ____Fleets enema or Magnesium Citrate as directed.   ____ Use CHG Soap or sage wipes as directed on instruction sheet   ____ Use inhalers on the day of surgery and bring to hospital day of surgery  ____ Stop Metformin and Janumet 2 days prior to surgery.    ____ Take 1/2 of usual insulin dose the night before surgery and none on the morning surgery.  ____ Follow recommendations from Cardiologist, Pulmonologist or PCP regarding stopping Aspirin, Coumadin, Plavix ,Eliquis, Effient, or Pradaxa, and Pletal.  X____Stop Anti-inflammatories such as Advil, Aleve, Ibuprofen, Motrin, Naproxen, Naprosyn, Goodies powders or aspirin products NOW-OK to take Tylenol   ____ Stop supplements until after surgery.     ____ Bring C-Pap to the hospital.

## 2019-09-24 NOTE — H&P (View-Only) (Signed)
09/23/2019 8:33 AM   Alexander Hogan 1922-10-16 [id number]  CC: Eroded artificial sphincter  HPI: Alexander Hogan is a 85 y.o. male who presents today for evaluation of possible erosion of his artificial sphincter through his scrotum.  PMH significant for prostate cancer s/p radical prostatectomy in 1999.  He experienced post prostatectomy incontinence and subsequently underwent placement of an artificial urinary sphincter approximately 12 years ago, per patient.  This was replaced in 2012 and subsequently revised.  He has had numerous office and ED visits over the past year for difficulties associated with the sphincter.  Notes from [facility] state that he has had difficulty activating and deactivating the device with the recommendation to leave it deactivated and allow him to leak in order to prevent an episode of urinary retention.  Today, he reports noticing a component of his artificial sphincter protruding through his scrotal tissue.  He states he first noticed this "recently," but is not more specific.  Recent medical history significant for metastatic melanoma, in hospice.  PMH: Past Medical History:  Diagnosis Date  . Arthritis   . Gout   . High blood pressure   . Prostate cancer ([place])   . Urinary retention     Surgical History: Past Surgical History:  Procedure Laterality Date  . PROSTATECTOMY  1999  . TOTAL KNEE ARTHROPLASTY  2005-2008   x2  . URINARY SPHINCTER IMPLANT      Home Medications:  Allergies as of 09/23/2019      Reactions   Hydrocodone Other (See Comments)   confusion   Penicillins Rash      Medication List       Accurate as of September 23, 2019 11:59 PM. If you have any questions, ask your nurse or doctor.        atorvastatin 80 MG tablet Commonly known as: LIPITOR Take 80 mg by mouth daily at 6 PM.   ciprofloxacin 500 MG tablet Commonly known as: CIPRO Take 1 tablet (500 mg total) by mouth every 12 (twelve) hours.   donepezil 10 MG  tablet Commonly known as: ARICEPT Take 10 mg by mouth at bedtime.   furosemide 80 MG tablet Commonly known as: LASIX Take 40 mg by mouth 2 (two) times daily as needed.   hyoscyamine 0.125 MG SL tablet Commonly known as: LEVSIN SL dissolve 1 to 2 tablets UNDER THE TONGUE every 4 hours if needed . DO NOT TAKE MORE THAN 12 TABLETS IN 24 HOURS   losartan 50 MG tablet Commonly known as: COZAAR Take 50 mg by mouth daily.   memantine 10 MG tablet Commonly known as: NAMENDA Take 10 mg by mouth 2 (two) times daily.   metoprolol succinate 25 MG 24 hr tablet Commonly known as: TOPROL-XL Take 25 mg by mouth daily.   nystatin powder Commonly known as: MYCOSTATIN/NYSTOP Apply topically 4 (four) times daily.   potassium chloride 10 MEQ tablet Commonly known as: KLOR-CON Take 10 mEq by mouth daily.   sulfamethoxazole-trimethoprim 800-160 MG tablet Commonly known as: BACTRIM DS Take 1 tablet by mouth every 12 (twelve) hours. Started by: Alexander Council, PA-C   torsemide 20 MG tablet Commonly known as: DEMADEX Take 40 mg by mouth daily.   triamcinolone ointment 0.5 % Commonly known as: KENALOG Apply 1 application topically 2 (two) times daily.   Uribel 118 MG Caps Take 1 capsule (118 mg total) by mouth daily as needed (up to 4 times a day).       Allergies:  Allergies  Allergen Reactions  . Hydrocodone Other (See Comments)    confusion  . Penicillins Rash    Family History: Family History  Problem Relation Age of Onset  . Bladder Cancer Mother 48  . Kidney cancer Father 26  . Heart attack Father   . Heart Problems Sister     Social History:   reports that he has never smoked. He has never used smokeless tobacco. He reports that he does not drink alcohol or use drugs.  ROS: UROLOGY Frequent Urination?: No Hard to postpone urination?: No Burning/pain with urination?: Yes Get up at night to urinate?: No Leakage of urine?: No Urine stream starts and stops?: No  Trouble starting stream?: No Do you have to strain to urinate?: No Blood in urine?: No Urinary tract infection?: No Sexually transmitted disease?: No Injury to kidneys or bladder?: No Painful intercourse?: No Weak stream?: No Erection problems?: No Penile pain?: No  Gastrointestinal Nausea?: No Vomiting?: No Indigestion/heartburn?: No Diarrhea?: No Constipation?: No  Constitutional Fever: No Night sweats?: No Weight loss?: No Fatigue?: No  Skin Skin rash/lesions?: No Itching?: No  Eyes Blurred vision?: No Double vision?: No  Ears/Nose/Throat Sore throat?: No Sinus problems?: No  Hematologic/Lymphatic Swollen glands?: No Easy bruising?: No  Cardiovascular Leg swelling?: No Chest pain?: No  Respiratory Cough?: No Shortness of breath?: No  Endocrine Excessive thirst?: No  Musculoskeletal Back pain?: No Joint pain?: No  Neurological Headaches?: No Dizziness?: No  Psychologic Depression?: No Anxiety?: No  Physical Exam: BP (!) 142/78   Pulse 92   Ht 5\' 11"  (1.803 m)   Wt 195 lb (88.5 kg)   BMI 27.20 kg/m   Constitutional:  Alert and oriented, no acute distress, nontoxic appearing HEENT: [place], AT Cardiovascular: No clubbing, cyanosis. 4+ pitting edema of the BLEs. Respiratory: Normal respiratory effort, no increased work of breathing GU: Uncircumcised penis, approximate 2cm protrusion of a silicone-capped sphincter component from the posteriolateral right scrotum without surrounding erythema or drainage Skin: Peau d'orange appearance of the BLEs R>L, scattered black skin lesions of the bilateral shins Neurologic: Grossly intact, no focal deficits, moving all 4 extremities Psychiatric: Normal mood and affect  Assessment & Plan:   1. Erosion of genitourinary device to surrounding tissue, initial encounter [facility]) Patient with apparent erosion of his artificial sphincter pump through his posteriolateral right scrotum.  Patient is well-appearing  with no local signs of infection.  Dr. Erlene Hogan spoke with both his wife and daughter via telephone today to discuss his surgical options.  She explained that his options include surgical removal of the artificial sphincter or electing for no intervention given his status as a hospice patient with metastatic melanoma.  She explained that electing for no intervention would lead to inevitable infection which would likely spread to his bloodstream and become fatal.  Patient and family have decided to discuss this overnight and will call us in the morning to inform us of their decision on how to proceed.  Starting patient on 2 weeks of Bactrim DS twice daily today for infection prevention in the interim.  Debroah Loop, PA-C  [facility] [address], [place], [place] [address] [phone]

## 2019-09-24 NOTE — Telephone Encounter (Signed)
Pt.'s wife states she was told by Dr. Erlene Quan to call the office today and let her know if he wanted to proceed with surgery. Pt. Is ready to proceed with surgery and message was sent to surgery coordinator (Amy).

## 2019-09-24 NOTE — Pre-Procedure Instructions (Signed)
ECG 12-lead9/30/2019 [place] Component Name Value Ref Range  Vent Rate (bpm) 63   PR Interval (msec) 228   QRS Interval (msec) 100   QT Interval (msec) 408   QTc (msec) 417   Other Result Information  This result has an attachment that is not available.  Result Narrative  Sinus rhythm 1st degree AV block Ventricular premature complex Late precordial R/S transition Consider inferior infarction Abnormal ECG  When compared with ECG of 13-May-2012 15:36, Ventricular premature complex is now present 1st degree AV block is now present I reviewed and concur with this report. Electronically signed EI:HDTPNS, MD, CHRISTOPHER [phone]) on 09/01/2018 4:52:17 PM  Status Results Details   Encounter Summary

## 2019-09-25 NOTE — Pre-Procedure Instructions (Addendum)
Messaged dr piscitello regarding abnormal EKG-Dr Piscitello states the interpretation in Care Everywhere is different than EKG done today. Requesting medical clearance.Called Amy and informed her of this.Clearance request and EKG sent to dr brandon and to dr Netty Starring

## 2019-09-25 NOTE — Pre-Procedure Instructions (Signed)
Progress Notes - documented in this encounter Dion Body, MD - 09/24/2019 2:15 PM EDT Formatting of this note might be different from the original. Chief Complaint  Patient presents with  . Surgical Clearance   HPI  Alexander Hogan is a 85 y.o. here for an acute issue  Erosion of genitourinary device: Patient was seen urgently by Dr. Hollice Espy yesterday to evaluate complication of Alexander device. She is concerned that this could lead to infection and urosepsis. I spoke to Dr. Erlene Quan on the phone today. She is planning to perform an excision of the device on Monday. She is anticipating general anesthesia and able to send him home on the same day. Patient is currently under the care of Alexander Hogan and has no plans to follow-up with [facility] oncology for the metastatic melanoma. He is here today with Alexander Hogan. Denies any significant pain or fever at this time. He has had urinary issues with incontinence for years. He has underlying dementia, coronary artery disease, and chronic kidney disease. No congestive heart failure, diabetes, or pulmonary disease. Denies any shortness of breath or chest pain at this time. He does have known history of chronic anemia.  ROS Review of systems is unremarkable for any active cardiac, respiratory, GI, GU, hematologic, neurologic, dermatologic, HEENT, or psychiatric symptoms except as noted above. No fevers, chills, or constitutional symptoms.   Current Outpatient Medications: acetaminophen (TYLENOL) 500 MG tablet, Take 500-1,000 mg by mouth every 6 (six) hours as needed. , Taking ASCORBATE CALCIUM (VITAMIN C ORAL), Take 1 tablet by mouth once daily., PRN Not Currently Taking atorvastatin (LIPITOR) 80 MG tablet, TAKE 1 TABLET DAILY, Taking cholecalciferol (VITAMIN D3) 2,000 unit tablet, Take 2,000 Units by mouth once daily , PRN Not Currently Taking hyoscyamine (LEVSIN/SL) 0.125 mg SL tablet, Dissolve 1-2 tabs under tongue every 4 hours prn abd  discomfort, Taking losartan (COZAAR) 50 MG tablet, Take 1 tablet (50 mg total) by mouth once daily, Taking methen-m.blue-s.phos-phsal-hyo (URIBEL) 118-10-40.8-36 mg Cap, Take 1 capsule by mouth every 4 (four) hours as needed. , Taking metoprolol succinate (TOPROL-XL) 25 MG XL tablet, Take 0.5 tablets (12.5 mg total) by mouth once daily, Taking mupirocin (BACTROBAN) 2 % ointment, APPLY 1 APPLICATION ON THE SKIN DAILY, Taking nystatin (MYCOSTATIN) 100,000 unit/gram powder, Apply topically 2 (two) times daily , Taking potassium chloride (KLOR-CON) 10 MEQ ER tablet, Take 1 tablet (10 mEq total) by mouth once daily, Taking TORsemide (DEMADEX) 20 MG tablet, TAKE 2 TABLETS BY MOUTH TWICE DAILY FOR 3 DAYS THEN 2 TABLETS EVERY DAY THEREAFTER (Patient taking differently: Take 20 mg by mouth once daily ), Taking triamcinolone 0.1 % lotion, Apply topically 3 (three) times daily, Taking  Allergies as of 09/24/2019 - Reviewed 09/24/2019  Allergen Reaction Noted  . Hydrocodone Other (See Comments)  . Penicillins Rash   Patient Active Problem List  Diagnosis  . History of prostate cancer s/p prostatectomy 1999  . Coronary artery disease - followed by Dr. Clayborn Bigness  . Essential hypertension  . Pure hypercholesterolemia (LDL 42 - 11/05/18)  . History of lymphoma  . Anemia of chronic disease (Hgb 12.1 - 01/26/19)  . History of coronary artery disease  . Dementia (CMS-HCC)  . CKD (chronic kidney disease) stage 3, (GFR 34, Cr 1.7 - 01/26/19) - followed by Dr. Candiss Norse  . Bilateral knee pain  . Bilateral ankle pain  . Lumbar stenosis with neurogenic claudication  . Lumbar radiculitis  . Metastatic melanoma (CMS-HCC)  . Encounter for antineoplastic chemotherapy and immunotherapy  .  Malignant melanoma of lower leg, right (CMS-HCC)  . 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 Anemia  . Chicken pox  . [place] measles  . Hematuria  . Hypertension   . Kidney stones  . Lymphoma (CMS-HCC) 1992  . Measles  . Prostate cancer (CMS-HCC) 1991   Past Surgical History:  Procedure Laterality Date  . AUS placement 2002  . CARDIAC SURGERY 2012  stent  . CYSTOURETHROSCOPY N/A 08/11/2012  Procedure: CYSTOURETHROSCOPY; Surgeon: Ponciano Ort, MD; Location: [facility] OR; Service: Urology; Laterality: N/A;  . EYE SURGERY Bilateral 1990  cataracts  . HERNIA REPAIR 1965  . JOINT REPLACEMENT Left 2013  knee  . JOINT REPLACEMENT Right 2009  knee  . PROSTATECTOMY PERINEAL 1991  . REMOVAL & REPLACEMENT INFLATABLE URINARY SPHINCTER PROSTHESIS N/A 08/11/2012  Procedure: REMOVAL & REPLACEMENT INFLATABLE URINARY SPHINCTER PROSTHESIS; Surgeon: Ponciano Ort, MD; Location: [facility] OR; Service: Urology; Laterality: N/A;   Vitals:  09/24/19 1446  BP: 130/80  Pulse: 91  SpO2: 96%  Weight: 89.4 kg (197 lb)  Height: 177.8 cm (5\' 10" )  PainSc: 1  PainLoc: Groin   Body mass index is 28.27 kg/m.  Exam  General. Well appearing elderly male in Alexander baseline demented state but recognizes who I am; NAD; VS reviewed  Eyes. Sclera and conjunctiva clear; Vision grossly intact; extraocular movements intact Neck. Supple.  Lungs. Respirations unlabored; clear to auscultation bilaterally Cardiovascular. Heart regular rate and rhythm without murmurs, gallops, or rubs GU. Pt deferred exam Neurologic. Alert; CN 2-12 grossly intact; no new focal deficits; arrive via wheelchair  Assessment and Plan  1. Erosion of other implanted material to surrounding tissue, subsequent encounter Acute. Patient is moderate risk for surgery. Cannot give more information than this without a cardiology evaluation with known history of coronary artery disease, anemia, and chronic kidney disease. There is risk of undergoing anesthesia and also risk of recovery being complicated due to age and chronic medical issues. It is my understanding from Dr. Erlene Quan that this procedure is  more for palliative reasons. Because of that, I understand the need of the surgery. I have told the patient that this would have to be Alexander decision on whether or not to proceed. There is moderate risk either way. I did speak to Alexander son, Alexander Hogan. He was very appreciative and I answered her questions the best that I could. Preop labs and EKG was performed today. I did review the labs. Renal function was not seen. Could not visualize the EKG.  F/u prn; >45 minutes managing pt's case  Dion Body, MD  Electronically signed by Dion Body, MD at 09/24/2019 3:33 PM EDT   Plan of Treatment - documented as of this encounter Upcoming Encounters Upcoming Encounters  Date Type Specialty Care Team Description  01/20/2020 Office Visit Urology Ponciano Ort, MD  [place], [place] [address]  [phone]  [phone] (Fax)     Visit Diagnoses - documented in this encounter Diagnosis  Erosion of other implanted material to surrounding tissue, subsequent encounter - Primary    Discontinued Medications - documented as of this encounter Medication Sig Discontinue Reason Start Date End Date  donepeziL (ARICEPT) 10 MG tablet  TAKE 1 TABLET DAILY No longer indicated 04/22/2019 09/24/2019  memantine (NAMENDA) 10 MG tablet  TAKE 1 TABLET TWICE A DAY No longer indicated 01/19/2019 09/24/2019  Images  Patient Contacts  Contact Name Contact Address Communication Relationship to  Patient  Edilson Vital [address] [place], [place] [address] [phone] [facility]) [phone] [facility]) Spouse  Alexander Hogan Unknown [phone] [facility]) Son or Daughter, Emergency Contact  Document Information  Primary Care Provider Other Service Providers Document Coverage Dates  Dion Body, MD (Apr. 08, 2015April 08, 2015 - Present) DM: [phone] (Work) [phone] (Fax) [place] [facility] [place], [place] [address] Family  Medicine [facility] [place], [place] [address] Alroy Dust, MD (Consulting Provider) DM: [phone] (Work) [phone] (Fax) [facility] [address] [place], [place] [address] Urology [facility] [place], [place] [address] Maude Leriche, NP (Nurse Practitioner) DM: [phone]  Oncology   Earlie Server, MD DM: [phone] (Work) [phone] (Fax) [place], [place] [address] Hematology and Oncology   Oct. 22, 2020October 22, 2020   [place] [address] [place], [place] [address]   Encounter Providers Encounter Date  Dion Body, MD (Attending) DM: [phone] (Work) [phone]) [place] [facility] [place], [place] [address] Family Medicine Oct. 22, 2020October 22, 2020

## 2019-09-27 MED ORDER — CLINDAMYCIN PHOSPHATE 900 MG/50ML IV SOLN
900.0000 mg | INTRAVENOUS | Status: AC
  Administered 2019-09-28: 15:00:00 900 mg via INTRAVENOUS

## 2019-09-28 ENCOUNTER — Ambulatory Visit
Admission: RE | Admit: 2019-09-28 | Discharge: 2019-09-28 | Disposition: A | Discharge status: Home / Self Care | Payer: Medicare Other | Source: Ambulatory Visit | Attending: Urology | Admitting: Urology

## 2019-09-28 ENCOUNTER — Encounter
Admission: RE | Disposition: A | Discharge status: Home / Self Care | Payer: Self-pay | Source: Ambulatory Visit | Attending: Urology

## 2019-09-28 ENCOUNTER — Ambulatory Visit: Payer: Medicare Other | Admitting: Anesthesiology

## 2019-09-28 ENCOUNTER — Emergency Department: Payer: Medicare Other

## 2019-09-28 ENCOUNTER — Encounter: Payer: Self-pay | Admitting: *Deleted

## 2019-09-28 ENCOUNTER — Other Ambulatory Visit: Payer: Self-pay

## 2019-09-28 ENCOUNTER — Telehealth: Payer: Self-pay | Admitting: Urology

## 2019-09-28 ENCOUNTER — Encounter: Payer: Self-pay | Admitting: Emergency Medicine

## 2019-09-28 ENCOUNTER — Inpatient Hospital Stay
Admission: EM | Admit: 2019-09-28 | Discharge: 2019-09-30 | DRG: 671 | Disposition: A | Discharge status: Hospice | Payer: Medicare Other | Attending: Internal Medicine | Admitting: Internal Medicine

## 2019-09-28 DIAGNOSIS — M109 Gout, unspecified: Secondary | ICD-10-CM | POA: Diagnosis present

## 2019-09-28 DIAGNOSIS — Y838 Other surgical procedures as the cause of abnormal reaction of the patient, or of later complication, without mention of misadventure at the time of the procedure: Secondary | ICD-10-CM | POA: Insufficient documentation

## 2019-09-28 DIAGNOSIS — Z66 Do not resuscitate: Secondary | ICD-10-CM | POA: Diagnosis present

## 2019-09-28 DIAGNOSIS — T83191A Other mechanical complication of urinary sphincter implant, initial encounter: Secondary | ICD-10-CM | POA: Insufficient documentation

## 2019-09-28 DIAGNOSIS — M1990 Unspecified osteoarthritis, unspecified site: Secondary | ICD-10-CM | POA: Insufficient documentation

## 2019-09-28 DIAGNOSIS — Z20828 Contact with and (suspected) exposure to other viral communicable diseases: Secondary | ICD-10-CM | POA: Diagnosis present

## 2019-09-28 DIAGNOSIS — C439 Malignant melanoma of skin, unspecified: Secondary | ICD-10-CM | POA: Diagnosis present

## 2019-09-28 DIAGNOSIS — Z9079 Acquired absence of other genital organ(s): Secondary | ICD-10-CM

## 2019-09-28 DIAGNOSIS — Z885 Allergy status to narcotic agent status: Secondary | ICD-10-CM | POA: Diagnosis not present

## 2019-09-28 DIAGNOSIS — N184 Chronic kidney disease, stage 4 (severe): Secondary | ICD-10-CM | POA: Insufficient documentation

## 2019-09-28 DIAGNOSIS — R32 Unspecified urinary incontinence: Secondary | ICD-10-CM | POA: Diagnosis present

## 2019-09-28 DIAGNOSIS — Z96653 Presence of artificial knee joint, bilateral: Secondary | ICD-10-CM | POA: Insufficient documentation

## 2019-09-28 DIAGNOSIS — Z79899 Other long term (current) drug therapy: Secondary | ICD-10-CM

## 2019-09-28 DIAGNOSIS — E7800 Pure hypercholesterolemia, unspecified: Secondary | ICD-10-CM | POA: Insufficient documentation

## 2019-09-28 DIAGNOSIS — F0390 Unspecified dementia without behavioral disturbance: Secondary | ICD-10-CM | POA: Insufficient documentation

## 2019-09-28 DIAGNOSIS — C799 Secondary malignant neoplasm of unspecified site: Secondary | ICD-10-CM | POA: Diagnosis present

## 2019-09-28 DIAGNOSIS — N183 Chronic kidney disease, stage 3 unspecified: Secondary | ICD-10-CM | POA: Diagnosis present

## 2019-09-28 DIAGNOSIS — T83111A Breakdown (mechanical) of urinary sphincter implant, initial encounter: Secondary | ICD-10-CM | POA: Diagnosis present

## 2019-09-28 DIAGNOSIS — N471 Phimosis: Secondary | ICD-10-CM | POA: Diagnosis present

## 2019-09-28 DIAGNOSIS — Z85820 Personal history of malignant melanoma of skin: Secondary | ICD-10-CM | POA: Insufficient documentation

## 2019-09-28 DIAGNOSIS — R011 Cardiac murmur, unspecified: Secondary | ICD-10-CM | POA: Insufficient documentation

## 2019-09-28 DIAGNOSIS — T83591A Infection and inflammatory reaction due to implanted urinary sphincter, initial encounter: Secondary | ICD-10-CM | POA: Diagnosis not present

## 2019-09-28 DIAGNOSIS — E872 Acidosis: Secondary | ICD-10-CM | POA: Diagnosis present

## 2019-09-28 DIAGNOSIS — Z8546 Personal history of malignant neoplasm of prostate: Secondary | ICD-10-CM

## 2019-09-28 DIAGNOSIS — N368 Other specified disorders of urethra: Secondary | ICD-10-CM | POA: Insufficient documentation

## 2019-09-28 DIAGNOSIS — Z87442 Personal history of urinary calculi: Secondary | ICD-10-CM | POA: Diagnosis not present

## 2019-09-28 DIAGNOSIS — N179 Acute kidney failure, unspecified: Secondary | ICD-10-CM | POA: Diagnosis present

## 2019-09-28 DIAGNOSIS — N390 Urinary tract infection, site not specified: Secondary | ICD-10-CM | POA: Diagnosis present

## 2019-09-28 DIAGNOSIS — Z880 Allergy status to penicillin: Secondary | ICD-10-CM | POA: Diagnosis not present

## 2019-09-28 DIAGNOSIS — E785 Hyperlipidemia, unspecified: Secondary | ICD-10-CM | POA: Diagnosis present

## 2019-09-28 DIAGNOSIS — A419 Sepsis, unspecified organism: Secondary | ICD-10-CM | POA: Diagnosis present

## 2019-09-28 DIAGNOSIS — T83718A Erosion of other implanted mesh and other prosthetic materials to surrounding organ or tissue, initial encounter: Secondary | ICD-10-CM

## 2019-09-28 DIAGNOSIS — Z8051 Family history of malignant neoplasm of kidney: Secondary | ICD-10-CM

## 2019-09-28 DIAGNOSIS — I129 Hypertensive chronic kidney disease with stage 1 through stage 4 chronic kidney disease, or unspecified chronic kidney disease: Secondary | ICD-10-CM | POA: Diagnosis present

## 2019-09-28 DIAGNOSIS — I2510 Atherosclerotic heart disease of native coronary artery without angina pectoris: Secondary | ICD-10-CM | POA: Diagnosis present

## 2019-09-28 DIAGNOSIS — Z8249 Family history of ischemic heart disease and other diseases of the circulatory system: Secondary | ICD-10-CM

## 2019-09-28 DIAGNOSIS — Z8052 Family history of malignant neoplasm of bladder: Secondary | ICD-10-CM

## 2019-09-28 HISTORY — PX: URINARY SPHINCTER IMPLANT: SHX2624

## 2019-09-28 HISTORY — PX: CYSTOSCOPY: SHX5120

## 2019-09-28 LAB — COMPREHENSIVE METABOLIC PANEL
ALT: 30 U/L (ref 0–44)
AST: 35 U/L (ref 15–41)
Albumin: 3.5 g/dL (ref 3.5–5.0)
Alkaline Phosphatase: 84 U/L (ref 38–126)
Anion gap: 12 (ref 5–15)
BUN: 29 mg/dL — ABNORMAL HIGH (ref 8–23)
CO2: 22 mmol/L (ref 22–32)
Calcium: 8.7 mg/dL — ABNORMAL LOW (ref 8.9–10.3)
Chloride: 103 mmol/L (ref 98–111)
Creatinine, Ser: 2.19 mg/dL — ABNORMAL HIGH (ref 0.61–1.24)
GFR calc Af Amer: 29 mL/min — ABNORMAL LOW (ref >60)
GFR calc non Af Amer: 25 mL/min — ABNORMAL LOW (ref >60)
Glucose, Bld: 123 mg/dL — ABNORMAL HIGH (ref 70–99)
Potassium: 4.3 mmol/L (ref 3.5–5.1)
Sodium: 137 mmol/L (ref 135–145)
Total Bilirubin: 0.8 mg/dL (ref 0.3–1.2)
Total Protein: 6.5 g/dL (ref 6.5–8.1)

## 2019-09-28 LAB — CBC WITH DIFFERENTIAL/PLATELET
Abs Immature Granulocytes: 0.05 10*3/uL (ref 0.00–0.07)
Basophils Absolute: 0 10*3/uL (ref 0.0–0.1)
Basophils Relative: 0 %
Eosinophils Absolute: 0 10*3/uL (ref 0.0–0.5)
Eosinophils Relative: 0 %
HCT: 37.8 % — ABNORMAL LOW (ref 39.0–52.0)
Hemoglobin: 12 g/dL — ABNORMAL LOW (ref 13.0–17.0)
Immature Granulocytes: 1 %
Lymphocytes Relative: 3 %
Lymphs Abs: 0.3 10*3/uL — ABNORMAL LOW (ref 0.7–4.0)
MCH: 27.9 pg (ref 26.0–34.0)
MCHC: 31.7 g/dL (ref 30.0–36.0)
MCV: 87.9 fL (ref 80.0–100.0)
Monocytes Absolute: 0.3 10*3/uL (ref 0.1–1.0)
Monocytes Relative: 3 %
Neutro Abs: 10 10*3/uL — ABNORMAL HIGH (ref 1.7–7.7)
Neutrophils Relative %: 93 %
Platelets: 130 10*3/uL — ABNORMAL LOW (ref 150–400)
RBC: 4.3 MIL/uL (ref 4.22–5.81)
RDW: 15.7 % — ABNORMAL HIGH (ref 11.5–15.5)
WBC: 10.7 10*3/uL — ABNORMAL HIGH (ref 4.0–10.5)
nRBC: 0 % (ref 0.0–0.2)

## 2019-09-28 LAB — URINALYSIS, ROUTINE W REFLEX MICROSCOPIC
Bacteria, UA: NONE SEEN
Bilirubin Urine: NEGATIVE
Glucose, UA: NEGATIVE mg/dL
Ketones, ur: NEGATIVE mg/dL
Nitrite: NEGATIVE
Protein, ur: 100 mg/dL — AB
RBC / HPF: >50 RBC/hpf — ABNORMAL HIGH (ref 0–5)
Specific Gravity, Urine: 1.015 (ref 1.005–1.030)
Squamous Epithelial / HPF: NONE SEEN (ref 0–5)
WBC, UA: >50 WBC/hpf — ABNORMAL HIGH (ref 0–5)
pH: 6 (ref 5.0–8.0)

## 2019-09-28 LAB — PROTIME-INR
INR: 1.1 (ref 0.8–1.2)
Prothrombin Time: 14 seconds (ref 11.4–15.2)

## 2019-09-28 LAB — LACTIC ACID, PLASMA: Lactic Acid, Venous: 3.2 mmol/L (ref 0.5–1.9)

## 2019-09-28 LAB — APTT: aPTT: 25 seconds (ref 24–36)

## 2019-09-28 SURGERY — INSERTION, ARTIFICIAL URINARY SPHINCTER
Anesthesia: General

## 2019-09-28 MED ORDER — BACITRACIN 50000 UNITS IM SOLR
INTRAMUSCULAR | Status: AC
  Filled 2019-09-28: qty 1

## 2019-09-28 MED ORDER — SUCCINYLCHOLINE CHLORIDE 20 MG/ML IJ SOLN
INTRAMUSCULAR | Status: DC | PRN
  Administered 2019-09-28: 120 mg via INTRAVENOUS

## 2019-09-28 MED ORDER — NYSTATIN 100000 UNIT/GM EX POWD
1.0000 g | Freq: Four times a day (QID) | CUTANEOUS | Status: DC | PRN
  Filled 2019-09-28: qty 15

## 2019-09-28 MED ORDER — HYOSCYAMINE SULFATE 0.125 MG SL SUBL
0.1250 mg | SUBLINGUAL_TABLET | SUBLINGUAL | Status: DC | PRN
  Filled 2019-09-28: qty 2

## 2019-09-28 MED ORDER — VITAMIN D 25 MCG (1000 UNIT) PO TABS
2000.0000 [IU] | ORAL_TABLET | Freq: Every evening | ORAL | Status: DC
  Administered 2019-09-29: 20:00:00 2000 [IU] via ORAL
  Filled 2019-09-28: qty 2

## 2019-09-28 MED ORDER — ROCURONIUM BROMIDE 50 MG/5ML IV SOLN
INTRAVENOUS | Status: AC
  Filled 2019-09-28: qty 1

## 2019-09-28 MED ORDER — ROCURONIUM BROMIDE 100 MG/10ML IV SOLN
INTRAVENOUS | Status: DC | PRN
  Administered 2019-09-28: 20 mg via INTRAVENOUS
  Administered 2019-09-28: 10 mg via INTRAVENOUS

## 2019-09-28 MED ORDER — FENTANYL CITRATE (PF) 100 MCG/2ML IJ SOLN
25.0000 ug | INTRAMUSCULAR | Status: DC | PRN
  Administered 2019-09-28 (×4): 25 ug via INTRAVENOUS

## 2019-09-28 MED ORDER — TRIMETHOPRIM 100 MG PO TABS: 100.0000 mg | ORAL_TABLET | Freq: Every day | ORAL | 2 refills | Status: AC

## 2019-09-28 MED ORDER — URELLE 81 MG PO TABS
1.0000 | ORAL_TABLET | Freq: Four times a day (QID) | ORAL | Status: DC | PRN
  Filled 2019-09-28: qty 1

## 2019-09-28 MED ORDER — LIDOCAINE HCL (PF) 2 % IJ SOLN
INTRAMUSCULAR | Status: AC
  Filled 2019-09-28: qty 10

## 2019-09-28 MED ORDER — MUPIROCIN 2 % EX OINT
1.0000 "application " | TOPICAL_OINTMENT | Freq: Two times a day (BID) | CUTANEOUS | Status: DC | PRN
  Filled 2019-09-28: qty 22

## 2019-09-28 MED ORDER — SODIUM CHLORIDE 0.9 % IV SOLN
2.0000 g | Freq: Once | INTRAVENOUS | Status: AC
  Administered 2019-09-29: 2 g via INTRAVENOUS
  Filled 2019-09-28: qty 2

## 2019-09-28 MED ORDER — ACETAMINOPHEN 500 MG PO TABS: 500.0000 mg | ORAL_TABLET | Freq: Four times a day (QID) | ORAL | Status: DC | PRN

## 2019-09-28 MED ORDER — LIDOCAINE HCL (CARDIAC) PF 100 MG/5ML IV SOSY
PREFILLED_SYRINGE | INTRAVENOUS | Status: DC | PRN
  Administered 2019-09-28: 100 mg via INTRAVENOUS

## 2019-09-28 MED ORDER — GLYCOPYRROLATE 0.2 MG/ML IJ SOLN
INTRAMUSCULAR | Status: DC | PRN
  Administered 2019-09-28: 0.2 mg via INTRAVENOUS

## 2019-09-28 MED ORDER — FAMOTIDINE 20 MG PO TABS
ORAL_TABLET | ORAL | Status: AC
  Administered 2019-09-28: 20 mg
  Filled 2019-09-28: qty 1

## 2019-09-28 MED ORDER — BUPIVACAINE HCL (PF) 0.5 % IJ SOLN
INTRAMUSCULAR | Status: DC | PRN
  Administered 2019-09-28: 10 mL

## 2019-09-28 MED ORDER — ONDANSETRON HCL 4 MG/2ML IJ SOLN
INTRAMUSCULAR | Status: AC
  Filled 2019-09-28: qty 2

## 2019-09-28 MED ORDER — ETOMIDATE 2 MG/ML IV SOLN
INTRAVENOUS | Status: DC | PRN
  Administered 2019-09-28: 20 mg via INTRAVENOUS

## 2019-09-28 MED ORDER — LACTATED RINGERS IV SOLN
INTRAVENOUS | Status: DC
  Administered 2019-09-28: 12:00:00 via INTRAVENOUS

## 2019-09-28 MED ORDER — FENTANYL CITRATE (PF) 100 MCG/2ML IJ SOLN
INTRAMUSCULAR | Status: AC
  Administered 2019-09-28: 25 ug via INTRAVENOUS
  Filled 2019-09-28: qty 2

## 2019-09-28 MED ORDER — FENTANYL CITRATE (PF) 100 MCG/2ML IJ SOLN
INTRAMUSCULAR | Status: DC | PRN
  Administered 2019-09-28 (×2): 50 ug via INTRAVENOUS

## 2019-09-28 MED ORDER — ONDANSETRON HCL 4 MG/2ML IJ SOLN
INTRAMUSCULAR | Status: DC | PRN
  Administered 2019-09-28: 4 mg via INTRAVENOUS

## 2019-09-28 MED ORDER — ATORVASTATIN CALCIUM 20 MG PO TABS
80.0000 mg | ORAL_TABLET | Freq: Every day | ORAL | Status: DC
  Administered 2019-09-29: 20:00:00 80 mg via ORAL
  Filled 2019-09-28: qty 4

## 2019-09-28 MED ORDER — HEPARIN SODIUM (PORCINE) 5000 UNIT/ML IJ SOLN
5000.0000 [IU] | Freq: Three times a day (TID) | INTRAMUSCULAR | Status: DC
  Administered 2019-09-29 – 2019-09-30 (×5): 5000 [IU] via SUBCUTANEOUS
  Filled 2019-09-28 (×5): qty 1

## 2019-09-28 MED ORDER — ETOMIDATE 2 MG/ML IV SOLN
INTRAVENOUS | Status: AC
  Filled 2019-09-28: qty 10

## 2019-09-28 MED ORDER — SUGAMMADEX SODIUM 200 MG/2ML IV SOLN
INTRAVENOUS | Status: DC | PRN
  Administered 2019-09-28: 180 mg via INTRAVENOUS

## 2019-09-28 MED ORDER — SODIUM CHLORIDE 0.9 % IV SOLN
1.0000 g | Freq: Once | INTRAVENOUS | Status: AC
  Administered 2019-09-28: 1 g via INTRAVENOUS
  Filled 2019-09-28: qty 1

## 2019-09-28 MED ORDER — ONDANSETRON HCL 4 MG/2ML IJ SOLN: 4.0000 mg | Freq: Once | INTRAMUSCULAR | Status: DC | PRN

## 2019-09-28 MED ORDER — TRIAMCINOLONE ACETONIDE 0.5 % EX OINT
1.0000 "application " | TOPICAL_OINTMENT | Freq: Two times a day (BID) | CUTANEOUS | Status: DC | PRN
  Filled 2019-09-28: qty 15

## 2019-09-28 MED ORDER — BUPIVACAINE HCL (PF) 0.5 % IJ SOLN
INTRAMUSCULAR | Status: AC
  Filled 2019-09-28: qty 30

## 2019-09-28 MED ORDER — SODIUM CHLORIDE 0.9 % IV BOLUS
1000.0000 mL | Freq: Once | INTRAVENOUS | Status: AC
  Administered 2019-09-28: 1000 mL via INTRAVENOUS

## 2019-09-28 MED ORDER — CEFAZOLIN SODIUM-DEXTROSE 2-4 GM/100ML-% IV SOLN
INTRAVENOUS | Status: AC
  Filled 2019-09-28: qty 100

## 2019-09-28 MED ORDER — SODIUM CHLORIDE 0.9 % IR SOLN
Status: DC | PRN
  Administered 2019-09-28: 16:00:00 100 mL

## 2019-09-28 MED ORDER — METOPROLOL SUCCINATE ER 25 MG PO TB24
12.5000 mg | ORAL_TABLET | Freq: Every day | ORAL | Status: DC
  Administered 2019-09-29 – 2019-09-30 (×2): 12.5 mg via ORAL
  Filled 2019-09-28 (×2): qty 1

## 2019-09-28 MED ORDER — PROPOFOL 10 MG/ML IV BOLUS
INTRAVENOUS | Status: AC
  Filled 2019-09-28: qty 20

## 2019-09-28 MED ORDER — SODIUM CHLORIDE FLUSH 0.9 % IV SOLN
INTRAVENOUS | Status: AC
  Filled 2019-09-28: qty 10

## 2019-09-28 MED ORDER — POTASSIUM CHLORIDE CRYS ER 10 MEQ PO TBCR
10.0000 meq | EXTENDED_RELEASE_TABLET | Freq: Every day | ORAL | Status: DC
  Administered 2019-09-29: 10 meq via ORAL
  Filled 2019-09-28: qty 1

## 2019-09-28 MED ORDER — FENTANYL CITRATE (PF) 100 MCG/2ML IJ SOLN
INTRAMUSCULAR | Status: AC
  Filled 2019-09-28: qty 2

## 2019-09-28 MED ORDER — SUGAMMADEX SODIUM 200 MG/2ML IV SOLN
INTRAVENOUS | Status: AC
  Filled 2019-09-28: qty 2

## 2019-09-28 SURGICAL SUPPLY — 77 items
BAG DECANTER FOR FLEXI CONT (MISCELLANEOUS) ×1 IMPLANT
BAG DRAIN CYSTO-URO LG1000N (MISCELLANEOUS) ×1 IMPLANT
BAG URINE DRAINAGE (UROLOGICAL SUPPLIES) ×3 IMPLANT
BASIN GRAD PLASTIC 32OZ STRL (MISCELLANEOUS) ×2 IMPLANT
BLADE SURG 15 STRL LF DISP TIS (BLADE) ×2 IMPLANT
BLADE SURG 15 STRL SS (BLADE) ×2
BNDG GAUZE ELAST 4 BULKY (GAUZE/BANDAGES/DRESSINGS) ×1 IMPLANT
BRIEF STRETCH FOR OB PAD LRG (UNDERPADS AND DIAPERS) ×1 IMPLANT
BRUSH SCRUB EZ 1% IODOPHOR (MISCELLANEOUS) ×3 IMPLANT
CATH COUDE FOLEY 5CC 14FR (CATHETERS) ×2 IMPLANT
CATH FOL 2WAY LX 18X5 (CATHETERS) ×2 IMPLANT
CATH FOLEY 2W COUNCIL 5CC 16FR (CATHETERS) ×2 IMPLANT
CATH FOLEY 2WAY SLVR  5CC 14FR (CATHETERS)
CATH FOLEY 2WAY SLVR 5CC 14FR (CATHETERS) ×2 IMPLANT
CATH FOLEY SIL 2WAY 14FR5CC (CATHETERS) ×2 IMPLANT
CATH URETL 5X70 OPEN END (CATHETERS) ×5 IMPLANT
COVER LIGHT HANDLE STERIS (MISCELLANEOUS) ×5 IMPLANT
COVER MAYO STAND REUSABLE (DRAPES) ×3 IMPLANT
COVER WAND RF STERILE (DRAPES) ×1 IMPLANT
DECANTER SPIKE VIAL GLASS SM (MISCELLANEOUS) ×5 IMPLANT
DERMABOND ADVANCED (GAUZE/BANDAGES/DRESSINGS) ×2
DERMABOND ADVANCED .7 DNX12 (GAUZE/BANDAGES/DRESSINGS) ×2 IMPLANT
DISSECTOR ROUND CHERRY 3/8 STR (MISCELLANEOUS) ×1 IMPLANT
DKW Deep Scrotal Retraction System ×2 IMPLANT
DRAPE 3/4 80X56 (DRAPES) ×3 IMPLANT
DRAPE UNDER BUTTOCK W/FLU (DRAPES) ×2 IMPLANT
DRAPE UTILITY 15X26 TOWEL STRL (DRAPES) ×3 IMPLANT
DRSG GAUZE FLUFF 36X18 (GAUZE/BANDAGES/DRESSINGS) ×2 IMPLANT
DRSG TELFA 3X8 NADH (GAUZE/BANDAGES/DRESSINGS) IMPLANT
ELECT PENCIL ROCKER SW 15FT (MISCELLANEOUS) ×3 IMPLANT
ELECT REM PT RETURN 9FT ADLT (ELECTROSURGICAL) ×3
ELECTRODE REM PT RTRN 9FT ADLT (ELECTROSURGICAL) ×1 IMPLANT
GAUZE 4X4 16PLY RFD (DISPOSABLE) ×2 IMPLANT
GAUZE PACKING IODOFORM 2 (PACKING) ×2 IMPLANT
GLOVE BIO SURGEON STRL SZ 6.5 (GLOVE) ×2 IMPLANT
GLOVE BIO SURGEONS STRL SZ 6.5 (GLOVE) ×1
GLOVE BIOGEL M STRL SZ7.5 (GLOVE) ×3 IMPLANT
GOWN STRL REUS W/ TWL LRG LVL3 (GOWN DISPOSABLE) ×2 IMPLANT
GOWN STRL REUS W/TWL LRG LVL3 (GOWN DISPOSABLE) ×4
GOWN STRL REUS W/TWL XL LVL4 (GOWN DISPOSABLE) ×3 IMPLANT
GUIDEWIRE GREEN .038 145CM (MISCELLANEOUS) ×2 IMPLANT
GUIDEWIRE STR DUAL SENSOR (WIRE) ×4 IMPLANT
LOOP VESSEL MINI 0.8X406 BLUE (MISCELLANEOUS) ×1 IMPLANT
LOOPS BLUE MINI 0.8X406MM (MISCELLANEOUS) ×2
LUBRICANT JELLY K Y 4OZ (MISCELLANEOUS) ×1 IMPLANT
NEEDLE HYPO 22GX1.5 SAFETY (NEEDLE) ×3 IMPLANT
PACK BASIN MAJOR ARMC (MISCELLANEOUS) ×1 IMPLANT
PACK CYSTO AR (MISCELLANEOUS) ×3 IMPLANT
PAD DRESSING TELFA 3X8 NADH (GAUZE/BANDAGES/DRESSINGS) ×1 IMPLANT
PLUG CATH AND CAP STER (CATHETERS) ×1 IMPLANT
POSITIONER SURGICAL ARM (MISCELLANEOUS) ×3 IMPLANT
RETRACTOR DEEP SCROTAL PENILE (Miscellaneous) ×1 IMPLANT
SET CYSTO W/LG BORE CLAMP LF (SET/KITS/TRAYS/PACK) ×5 IMPLANT
SHEET LAVH (DRAPES) ×3 IMPLANT
SOL .9 NS 3000ML IRR  AL (IV SOLUTION)
SOL .9 NS 3000ML IRR UROMATIC (IV SOLUTION) ×1 IMPLANT
STAPLER SKIN PROX 35W (STAPLE) ×2 IMPLANT
SUPPORTER ATHLETIC XL (MISCELLANEOUS) ×2
SUPPORTER ATHLETIC XL 3X44-50X (MISCELLANEOUS) IMPLANT
SURGILUBE 2OZ TUBE FLIPTOP (MISCELLANEOUS) ×1 IMPLANT
SUT MNCRL AB 4-0 PS2 18 (SUTURE) ×6 IMPLANT
SUT SILK 0 FSL (SUTURE) ×2 IMPLANT
SUT VIC AB 0 CT1 27 (SUTURE)
SUT VIC AB 0 CT1 27XBRD ANTBC (SUTURE) ×1 IMPLANT
SUT VIC AB 2-0 UR6 27 (SUTURE) ×4 IMPLANT
SUT VIC AB 3-0 SH 27 (SUTURE) ×4
SUT VIC AB 3-0 SH 27X BRD (SUTURE) ×4 IMPLANT
SYR 10ML LL (SYRINGE) ×7 IMPLANT
SYR 30ML LL (SYRINGE) ×3 IMPLANT
SYR BULB IRRIG 60ML STRL (SYRINGE) ×3 IMPLANT
SYRINGE IRR TOOMEY STRL 70CC (SYRINGE) ×2 IMPLANT
TOWEL OR 17X26 4PK STRL BLUE (TOWEL DISPOSABLE) ×6 IMPLANT
TUBING CONNECTING 10 (TUBING) ×2 IMPLANT
TUBING CONNECTING 10' (TUBING) ×1
VALVE UROSEAL ADJ ENDO (VALVE) ×2 IMPLANT
WATER STERILE IRR 1000ML POUR (IV SOLUTION) ×1 IMPLANT
YANKAUER SUCT BULB TIP 10FT TU (MISCELLANEOUS) ×3 IMPLANT

## 2019-09-28 NOTE — Op Note (Signed)
Date of procedure: 09/28/19  Preoperative diagnosis:  1. Scrotal erosion of artificial urinary sphincter  Postoperative diagnosis:  1. Same as above 2. Circumferential urethral erosion of sphincter cuff  Procedure: 1. Explant of artificial urinary sphincter 2. Flexible cystoscopy 3. Difficult Foley catheter placement over a wire  Surgeon: Hollice Espy, MD  Assistant surgeon: Nickolas Madrid, MD  Anesthesia: General  Complications: None  Intraoperative findings: Circumferential complete erosion of AUS cuff within the urethra with large urethral defect.  All components of AUS removed.  EBL: Minimal  Specimens: AUS device  Drains: 14 French coud converted to council tip Foley catheter  Indication: Alexander Hogan is a 85 y.o. patient with scrotal erosion of his artificial urinary sphincter.  After reviewing the management options for treatment, he elected to proceed with the above surgical procedure(s). We have discussed the potential benefits and risks of the procedure, side effects of the proposed treatment, the likelihood of the patient achieving the goals of the procedure, and any potential problems that might occur during the procedure or recuperation. Informed consent has been obtained.  Description of procedure:  The patient was taken to the operating room and general anesthesia was induced.  The patient was placed in the dorsal lithotomy position, prepped and draped in the usual sterile fashion, and preoperative antibiotics were administered. A preoperative time-out was performed.   First, the extruded scrotal area an first, the extruded scrotal wound was dressed and able to be fully exposed along with the adjacent tubing.  Finger dissection was performed along the tubing towards the right inguinal area in order to free up the tubing.  Notably, there is no purulent extravasation although the overlying scrotal skin was quite fixed to the tubing.  Is able to finger dissect  all the way up to the level of the pubic ramus.  The tubing slid easily up to this point.  Next, a counterincision was created in the right subinguinal area overlying the previous incision site, approximately 6 3 cm in length transversely.  The dissection was carried down through the subcutaneous tissues until the tubing was palpable at the base of the incision.  Bovie electrocautery was used to cut down over the tubing itself and along the length of it the tubing could be circumferentially elevated through the wound.  I was able to trace the tubing down towards the pubic ramus and ultimately slide the tubing back-and-forth connecting with the scrotal pump.  This tubing was cut and the scrotal pump was removed in entirety.  Fluid was aspirated to empty out the reservoir.  I then turned my attention back towards tracing the second set of tubing towards the reservoir opening the capsule and 3 the reservoir piece quite easily.  The second set of tubing extending from the reservoir to the cuff was identified and traced down towards the inguinal area thereby freeing it up.  Next, attention was turned to removal of the urethral cuff.  The patient was noted to have a fairly significant phimosis which was dilated using mosquitoes to the lab for attempted placement of a 16 French Foley catheter unsuccessfully.  There is resistance near the level of the bulbar urethra concerning for possible catheter erosion or urethral stricture at this site.  At this point in time, flexible cystoscopy was performed indicating a complete erosion of the urethra caused which was circumferentially eroded in entirety through the urethra which spanned the entire length of the cuff.  The opening through this was fairly tight and just  barely able to accommodate the 16 [place] partially . I was unable to advance the scope beyond the most proximal portion of this, unclear if it was related to the cuff where a more proximal stricture.  Under direct  vision, I was able to advance a sensor wire presumably into the bladder.  This remained in place and the cystoscope was removed.  To confirm placement of the wire in the bladder, I did advance a 5 [place] open-ended ureteral catheter nearly helped and withdraw the wire with a brisk return of clear yellow urine indicating good position.  The wire was then replaced with a Super Stiff wire.  I attempted multiple catheters over the wire including smaller catheters 14 [employer], coud type and converted council amongst others but without success.Marland Kitchen  Ultimately leaving the wire in place, I did go ahead and make an incision in the perineum approximately 2 and half centimeters in length overlying the cuff which was indeed palpable.  I was able to cut down using Bovie electrocautery directly onto the tubing which was traced both laterally towards the reservoir as well as medially where the cuff was able to be identified.  I was able to pull the reservoir tubing through the peritoneal incision so that only the urethral cuff remained.  Ultimately using Metzenbaum scissors, I was able to cut the cuff tab only without injuring any adjacent tissue freeing the cuff tab from the button and thereby removing the entire cause in entirety.  I reapproximated the entire prosthesis including all of the tubing and the 3 components to ensure that there is no residual foreign body left in the nation which was deemed satisfactory at this point.  With removal of the cuff, there was a large defect on his ventral urethra through which I was able to see the wire traversing.  Using direct visualization, ultimately we were able to convert a 14 [place] coud into a council tip and advance it over the wire through the area of defect and into the true lumen of the urethra guiding it gently from below as well.  Ultimately it was able to be hubbed, the wire removed and the bladder irrigated confirming successful placement within the bladder.  The  balloon was filled with 10 cc of sterile water.  Finally, attention was turned to closure of his perineal defect.  This was closed in 3 layers using 2-0 Vicryl suture.  The perineal skin was closed using interrupted 4-0 Monocryl's loosely.  The right subinguinal incision was closed at the deep base/capsular level using 2-0 Vicryl.  The overlying subcutaneous tissues were closed using 2-0 Vicryl as well in a running 4-0 Monocryl subcuticular was used to close the skin.  Notably, before any of these wounds are closed, each were irrigated copiously using bacitracin antibiotic solution.  The scrotal incision was left open and packed with 1 inch iodoform gauze.  Scrotal support and a scrotal device were applied.  Finally, the patient was cleaned and dried, repositioned in supine position reversed anesthesia taken the PACU in stable condition.  Postoperatively, I discussed findings both with the patient's wife and his daughter by telephone.  They are aware of the cuff erosion and need for long-term Foley catheter which will require change over wire at least initially.  We discussed whether or not to keep him overnight and we all agree that if he does well in the recovery area, he will be more comfortable at home and likely more oriented.  They do understand that  there is a high risk of complication given his age and comorbidities as well as extent of the erosion extrusion of his device.  They will continue oral antibiotics and complete the full course of Bactrim.  Once complete, will place him on suppressive trimethoprim.  All questions answered.  Hollice Espy, M.D.

## 2019-09-28 NOTE — ED Triage Notes (Signed)
Pt arrived to [facility] ED via [facility]. Pt family states that he had procedure performed today with the urologist at [facility], sent home with urinary catheter, and began to have altered mental status and tremors.

## 2019-09-28 NOTE — Anesthesia Preprocedure Evaluation (Addendum)
Anesthesia Evaluation  Patient identified by MRN, date of birth, ID band  Reviewed: Allergy & Precautions, NPO status , Patient's Chart, lab work & pertinent test results, reviewed documented beta blocker date and time   Airway Mallampati: III       Dental  (+) Chipped   Pulmonary neg pulmonary ROS,    Pulmonary exam normal        Cardiovascular hypertension, + CAD  Normal cardiovascular exam+ Valvular Problems/Murmurs      Neuro/Psych PSYCHIATRIC DISORDERS Dementia  Neuromuscular disease    GI/Hepatic negative GI ROS, Neg liver ROS,   Endo/Other  negative endocrine ROS  Renal/GU Renal InsufficiencyRenal disease     Musculoskeletal  (+) Arthritis , Osteoarthritis,    Abdominal Normal abdominal exam  (+)   Peds negative pediatric ROS (+)  Hematology  (+) anemia ,   Anesthesia Other Findings   Reproductive/Obstetrics                            Anesthesia Physical Anesthesia Plan  ASA: III  Anesthesia Plan: General   Post-op Pain Management:    Induction:   PONV Risk Score and Plan:   Airway Management Planned: Oral ETT  Additional Equipment:   Intra-op Plan:   Post-operative Plan: Extubation in OR  Informed Consent: I have reviewed the patients History and Physical, chart, labs and discussed the procedure including the risks, benefits and alternatives for the proposed anesthesia with the patient or authorized representative who has indicated his/her understanding and acceptance.     Dental advisory given  Plan Discussed with: CRNA and Surgeon  Anesthesia Plan Comments: (Patient's cardiac status has been stable with no apparent issues.  The case was discussed with Dr. Erlene Quan and she felt it was of an urgent nature and needed to be done today.  Patient and family agree with risks.)       Anesthesia Quick Evaluation

## 2019-09-28 NOTE — Telephone Encounter (Signed)
Please call to check on this postoperative patient today.  Please let his facility know that he is a Foley catheter that should not be exchanged or manipulated in any way other than by our office.  Is very difficult to replace.  Additionally, he has scrotal packing.  The instructions will be to pull out about 2 inches of packing tape per day and cut/amputate until the entire packing is removed.  There are any questions, please call our office.  Feel free to fax over these orders if they need them.  Hollice Espy, MD

## 2019-09-28 NOTE — ED Notes (Signed)
Dr. Cinda Quest in exam room to discuss plan of care. Family now at the bedside with pt.

## 2019-09-28 NOTE — Interval H&P Note (Signed)
History and Physical Interval Note:  09/28/2019 2:16 PM  Alexander Hogan  has presented today for surgery, with the diagnosis of artificial urinary sphincter errosion.  The various methods of treatment have been discussed with the patient and family. After consideration of risks, benefits and other options for treatment, the patient has consented to  Procedure(s) with comments: Explant of ARTIFICIAL URINARY SPHINCTER (N/A) - EXPLANT OF AUS, NOT PLACEMENT CYSTOSCOPY FLEXABLE (N/A) as a surgical intervention.  The patient's history has been reviewed, patient examined, no change in status, stable for surgery.  I have reviewed the patient's chart and labs.  Questions were answered to the patient's satisfaction.    RRR CTAB  Lengthy discussion again today with the patient, his wife, and his daughter regarding the plan for explant of the eroded device.  On exam today, there is no purulence however there is some tethering overlying the tubing in the right upper quadrant with a more stuck appearance of the overlying skin which is concerning thus further necessitating need for explant of device.  We again reviewed the risk of perioperative mortality given his advanced age and comorbidities.  That being said, this is relatively urgent procedure of the patient and family seem to understand the risk and benefits.  Ultimately they are agreeable for explant today.  Hollice Espy

## 2019-09-28 NOTE — Anesthesia Procedure Notes (Signed)
Procedure Name: Intubation Date/Time: 09/28/2019 3:04 PM Performed by: Jerrye Noble, CRNA Pre-anesthesia Checklist: Patient identified, Emergency Drugs available, Suction available, Patient being monitored and Timeout performed Patient Re-evaluated:Patient Re-evaluated prior to induction Oxygen Delivery Method: Circle system utilized Preoxygenation: Pre-oxygenation with 100% oxygen Induction Type: IV induction Ventilation: Mask ventilation without difficulty Laryngoscope Size: McGraph and 4 Grade View: Grade I Tube type: Oral Tube size: 7.5 mm Number of attempts: 1 Airway Equipment and Method: Stylet Placement Confirmation: ETT inserted through vocal cords under direct vision,  positive ETCO2 and breath sounds checked- equal and bilateral Secured at: 22 cm Tube secured with: Tape Dental Injury: Teeth and Oropharynx as per pre-operative assessment

## 2019-09-28 NOTE — ED Notes (Signed)
Admitting MD at the bedside or pt evaluation

## 2019-09-28 NOTE — ED Provider Notes (Signed)
[facility] Emergency Department Provider Note   ____________________________________________   First MD Initiated Contact with Patient 09/28/19 2156     (approximate)  I have reviewed the triage vital signs and the nursing notes.   HISTORY  Chief Complaint Altered Mental Status     HPI Alexander Hogan is a 85 y.o. male who had removal of a artificial urethral catheter today.  He was sent back home and developed altered mental status and shaking.  EMS was called.  They report tachycardia and low blood pressure with no fever.  Patient himself says he feels well and wants to go home and go to bed.         Past Medical History:  Diagnosis Date  . Arthritis   . Gout   . Heart murmur   . High blood pressure   . History of kidney stones    h/o  . Prostate cancer ([place])   . Urinary retention     Patient Active Problem List   Diagnosis Date Noted  . Anemia of chronic disease 11/19/2018  . Goals of care, counseling/discussion 08/26/2018  . Melanoma of skin ([place]) 08/26/2018  . Personal history of prostate cancer 10/08/2017  . Stress incontinence of urine 10/08/2017  . Chronic kidney disease, stage 4, severely decreased GFR ([facility]) 05/20/2016  . Hyperlipidemia 05/20/2016  . Lumbar radiculitis 09/24/2014  . Bilateral knee pain 08/03/2014  . Dementia ([place]) 06/07/2014  . History of coronary artery disease 06/07/2014  . History of lymphoma 09/07/2012  . Coronary artery disease 07/21/2012  . Essential (primary) hypertension 07/21/2012  . Pure hypercholesterolemia 07/21/2012    Past Surgical History:  Procedure Laterality Date  . PROSTATECTOMY  1999  . TOTAL KNEE ARTHROPLASTY Bilateral 2005-2008   x2  . URINARY SPHINCTER IMPLANT     x2    Prior to Admission medications   Medication Sig Start Date End Date Taking? Authorizing Provider  acetaminophen (TYLENOL) 500 MG tablet Take 500 mg by mouth every 6 (six) hours as needed (for pain.).   Yes  [provider]  atorvastatin (LIPITOR) 80 MG tablet Take 80 mg by mouth daily at 6 PM.  04/20/16  Yes [provider]  Cholecalciferol (VITAMIN D3) 50 MCG (2000 UT) TABS Take 2,000 Units by mouth every evening.   Yes [provider]  hyoscyamine (LEVSIN SL) 0.125 MG SL tablet Place 0.125-0.25 mg under the tongue every 4 (four) hours as needed (abdominal cramping.).  04/10/16  Yes [provider]  losartan (COZAAR) 50 MG tablet Take 50 mg by mouth every evening.  08/06/18  Yes [provider]  Meth-Hyo-M Bl-Na Phos-Ph Sal (URIBEL) 118 MG CAPS Take 1 capsule (118 mg total) by mouth daily as needed (up to 4 times a day). Patient taking differently: Take 118 mg by mouth 4 (four) times daily as needed (pain/urinary issues.).  03/09/19  Yes Stoioff, Ronda Fairly, MD  metoprolol succinate (TOPROL-XL) 25 MG 24 hr tablet Take 12.5 mg by mouth daily.  05/01/16  Yes [provider]  mupirocin ointment (BACTROBAN) 2 % Apply 1 application topically 2 (two) times daily as needed (skin abrasions.).   Yes [provider]  nystatin (MYCOSTATIN/NYSTOP) powder Apply topically 4 (four) times daily. Patient taking differently: Apply 1 g topically 4 (four) times daily as needed (metastatic melanoma).  11/10/18  Yes Billey Co, MD  potassium chloride (K-DUR) 10 MEQ tablet Take 10 mEq by mouth daily. 08/06/18  Yes [provider]  sulfamethoxazole-trimethoprim (BACTRIM DS) 800-160 MG tablet Take 1 tablet by mouth every 12 (twelve) hours. 09/23/19  Yes McGowan, Larene Beach A, PA-C  torsemide (DEMADEX) 20 MG tablet Take 20 mg by mouth daily.  08/06/18  Yes [provider]  triamcinolone ointment (KENALOG) 0.5 % Apply 1 application topically 2 (two) times daily. Patient taking differently: Apply 1 application topically 2 (two) times daily as needed (metastatic melanoma).  12/25/18  Yes Billey Co, MD  trimethoprim (TRIMPEX) 100 MG tablet Take 1 tablet (100  mg total) by mouth daily. Please begin the suppressive dose after you have completed the entire treatment course of bactrim 09/28/19  Yes Hollice Espy, MD    Allergies Hydrocodone and Penicillins  Family History  Problem Relation Age of Onset  . Bladder Cancer Mother 47  . Kidney cancer Father 60  . Heart attack Father   . Heart Problems Sister     Social History Social History   Tobacco Use  . Smoking status: Never Smoker  . Smokeless tobacco: Never Used  Substance Use Topics  . Alcohol use: Never    Frequency: Never  . Drug use: Never    Review of Systems  Constitutional: No fever/chills Eyes: No visual changes. ENT: No sore throat. Cardiovascular: Denies chest pain. Respiratory: Denies shortness of breath. Gastrointestinal: No abdominal pain.  No nausea, no vomiting.  No diarrhea.  No constipation. Genitourinary: Negative for dysuria. Musculoskeletal: Negative for back pain. Skin: Negative for rash. Neurological: Negative for headaches, focal weakness   ____________________________________________   PHYSICAL EXAM:  VITAL SIGNS: ED Triage Vitals  Enc Vitals Group     BP 09/28/19 2203 125/72     Pulse Rate 09/28/19 2203 (!) 114     Resp 09/28/19 2203 16     Temp 09/28/19 2201 98.4 F (36.9 C)     Temp Source 09/28/19 2201 Oral     SpO2 09/28/19 2203 95 %     Weight 09/28/19 2201 194 lb 0.1 oz (88 kg)     Height 09/28/19 2201 5\' 11"  (1.803 m)     Head Circumference --      Peak Flow --      Pain Score 09/28/19 2201 0     Pain Loc --      Pain Edu? --      Excl. in [place]? --     Constitutional: Alert and oriented. Well appearing and in no acute distress. Eyes: Conjunctivae are normal. Head: Atraumatic. Nose: No congestion/rhinnorhea. Mouth/Throat: Mucous membranes are moist.  Oropharynx non-erythematous. Neck: No stridor. Cardiovascular: Normal rate, regular rhythm. Grossly normal heart sounds.  Good peripheral circulation. Respiratory: Normal  respiratory effort.  No retractions. Lungs CTAB. Gastrointestinal: Soft and nontender. No distention. No abdominal bruits. No CVA tenderness. Genitourinary: Patient with Foley in place.  Surgical wound looks good.  There is some erythema but it looks more like tinea cruris than anything else possibly some yeast as there are some small satellite lesions and the redness goes up in the fold of the groin between the leg and the perineum.  Most of it is they are not around the surgical wound. Musculoskeletal: No lower extremity tenderness nor edema.  No joint effusions. Neurologic:  Normal speech and language. No gross focal neurologic deficits are appreciated. No gait instability. Skin:  Skin is warm, dry and intact. No rash noted. Psychiatric: Mood and affect are normal. Speech and behavior are normal.  ____________________________________________   LABS (all labs ordered are listed, but only abnormal  results are displayed)  Labs Reviewed  LACTIC ACID, PLASMA - Abnormal; Notable for the following components:      Result Value   Lactic Acid, Venous 3.2 (*)    All other components within normal limits  COMPREHENSIVE METABOLIC PANEL - Abnormal; Notable for the following components:   Glucose, Bld 123 (*)    BUN 29 (*)    Creatinine, Ser 2.19 (*)    Calcium 8.7 (*)    GFR calc non Af Amer 25 (*)    GFR calc Af Amer 29 (*)    All other components within normal limits  CBC WITH DIFFERENTIAL/PLATELET - Abnormal; Notable for the following components:   WBC 10.7 (*)    Hemoglobin 12.0 (*)    HCT 37.8 (*)    RDW 15.7 (*)    Platelets 130 (*)    Neutro Abs 10.0 (*)    Lymphs Abs 0.3 (*)    All other components within normal limits  URINALYSIS, ROUTINE W REFLEX MICROSCOPIC - Abnormal; Notable for the following components:   Color, Urine YELLOW (*)    APPearance CLOUDY (*)    Hgb urine dipstick LARGE (*)    Protein, ur 100 (*)    Leukocytes,Ua LARGE (*)    RBC / HPF >50 (*)    WBC, UA >50  (*)    All other components within normal limits  CULTURE, BLOOD (ROUTINE X 2)  CULTURE, BLOOD (ROUTINE X 2)  URINE CULTURE  SARS CORONAVIRUS 2 (TAT 6-24 HRS)  APTT  PROTIME-INR  LACTIC ACID, PLASMA   ____________________________________________  EKG  EKG Hogan interpreted by me shows sinus tachycardia rate of 115 left axis irregular baseline poor R wave progression but no acute changes. ____________________________________________  RADIOLOGY  ED MD interpretation: Chest x-ray Hogan by radiology reviewed by me does not show any pneumonia  Official radiology report(s): Dg Chest Port 1 View  Result Date: 09/28/2019 CLINICAL DATA:  Sepsis workup EXAM: PORTABLE CHEST 1 VIEW COMPARISON:  None. FINDINGS: Cardiac shadows within normal limits. Aortic calcifications are noted. The lungs are hypoinflated but clear. No bony abnormality is seen. IMPRESSION: No acute abnormality noted. Electronically Signed   By: Inez Catalina M.D.   On: 09/28/2019 22:42    ____________________________________________   PROCEDURES  Procedure(s) performed (including Critical Care):  Procedures   ____________________________________________   INITIAL IMPRESSION / ASSESSMENT AND PLAN / ED COURSE Alexander Hogan was evaluated in Emergency Department on 09/28/2019 for the symptoms described in the history of present illness. He was evaluated in the context of the global COVID-19 pandemic, which necessitated consideration that the patient might be at risk for infection with the SARS-CoV-2 virus that causes COVID-19. Institutional protocols and algorithms that pertain to the evaluation of patients at risk for COVID-19 are in a state of rapid change based on information released by regulatory bodies including the CDC and federal and state organizations. These policies and algorithms were followed during the patient's care in the ED.      Patient initially wanted to go home but family was able to talk him into  staying.  He does look septic and has a urinary tract source.  I have already given him antibiotics we will plan on getting him in the hospital.       ____________________________________________   FINAL CLINICAL IMPRESSION(S) / ED DIAGNOSES  Final diagnoses:  Sepsis, due to unspecified organism, unspecified whether acute organ dysfunction present [facility])  Urinary tract infection with hematuria, site unspecified  ED Discharge Orders    None       Note:  This document was prepared using Dragon voice recognition software and may include unintentional dictation errors.    Nena Polio, MD 09/28/19 [phone]

## 2019-09-28 NOTE — Transfer of Care (Signed)
Immediate Anesthesia Transfer of Care Note  Patient: Alexander Hogan  Procedure(s) Performed: Explant of ARTIFICIAL URINARY SPHINCTER (N/A ) CYSTOSCOPY FLEXABLE (N/A )  Patient Location: PACU  Anesthesia Type:General  Level of Consciousness: awake, alert  and oriented  Airway & Oxygen Therapy: Patient connected to face mask oxygen  Post-op Assessment: Post -op Vital signs reviewed and stable  Post vital signs: stable  Last Vitals:  Vitals Value Taken Time  BP 141/104 09/28/19 1715  Temp    Pulse 85 09/28/19 1715  Resp 21 09/28/19 1715  SpO2 95 % 09/28/19 1715  Vitals shown include unvalidated device data.  Last Pain:  Vitals:   09/28/19 1130  TempSrc: Tympanic  PainSc: 0-No pain         Complications: No apparent anesthesia complications

## 2019-09-28 NOTE — Discharge Instructions (Signed)
You have a Foley catheter a there is very difficult to place.  This is can stand for at least a month.  In the first exchange, will do it in the office over a wire.  Please do not allow anyone to manipulate this catheter or remove it whatsoever.  There is some packing in your scrotum.  We can a call tomorrow and asked that your facility pull out about an inch to 2 inches daily and cut it until the entire packing is removed.  You may sponge bath but no showering until the entire scrotal packing is gone.  No soaking in the bathtub.   AMBULATORY SURGERY  DISCHARGE INSTRUCTIONS   1) The drugs that you were given will stay in your system until tomorrow so for the next 24 hours you should not:  A) Drive an automobile B) Make any legal decisions C) Drink any alcoholic beverage   2) You may resume regular meals tomorrow.  Today it is better to start with liquids and gradually work up to solid foods.  You may eat anything you prefer, but it is better to start with liquids, then soup and crackers, and gradually work up to solid foods.   3) Please notify your doctor immediately if you have any unusual bleeding, trouble breathing, redness and pain at the surgery site, drainage, fever, or pain not relieved by medication. 4)   5) Your post-operative visit with Dr.                                     is: Date:                        Time:    Please call to schedule your post-operative visit.  6) Additional Instructions:

## 2019-09-28 NOTE — Anesthesia Post-op Follow-up Note (Signed)
Anesthesia QCDR form completed.        

## 2019-09-28 NOTE — ED Notes (Signed)
Date and time results received: 09/28/19 2244 (use smartphrase ".now" to insert current time)  Test: Lactic Acid Critical Value: 3.2  Name of Provider Notified: Dr. Cinda Quest  Orders Received? Or Actions Taken?: Actions Taken: N/A

## 2019-09-29 ENCOUNTER — Encounter: Payer: Self-pay | Admitting: Urology

## 2019-09-29 ENCOUNTER — Telehealth: Payer: Self-pay | Admitting: Urology

## 2019-09-29 LAB — CBC
HCT: 32.9 % — ABNORMAL LOW (ref 39.0–52.0)
Hemoglobin: 10.4 g/dL — ABNORMAL LOW (ref 13.0–17.0)
MCH: 27.4 pg (ref 26.0–34.0)
MCHC: 31.6 g/dL (ref 30.0–36.0)
MCV: 86.8 fL (ref 80.0–100.0)
Platelets: 123 10*3/uL — ABNORMAL LOW (ref 150–400)
RBC: 3.79 MIL/uL — ABNORMAL LOW (ref 4.22–5.81)
RDW: 15.7 % — ABNORMAL HIGH (ref 11.5–15.5)
WBC: 12.8 10*3/uL — ABNORMAL HIGH (ref 4.0–10.5)
nRBC: 0 % (ref 0.0–0.2)

## 2019-09-29 LAB — BASIC METABOLIC PANEL
Anion gap: 7 (ref 5–15)
BUN: 26 mg/dL — ABNORMAL HIGH (ref 8–23)
CO2: 23 mmol/L (ref 22–32)
Calcium: 8.3 mg/dL — ABNORMAL LOW (ref 8.9–10.3)
Chloride: 108 mmol/L (ref 98–111)
Creatinine, Ser: 1.96 mg/dL — ABNORMAL HIGH (ref 0.61–1.24)
GFR calc Af Amer: 33 mL/min — ABNORMAL LOW (ref >60)
GFR calc non Af Amer: 28 mL/min — ABNORMAL LOW (ref >60)
Glucose, Bld: 135 mg/dL — ABNORMAL HIGH (ref 70–99)
Potassium: 4.6 mmol/L (ref 3.5–5.1)
Sodium: 138 mmol/L (ref 135–145)

## 2019-09-29 LAB — SARS CORONAVIRUS 2 (TAT 6-24 HRS): SARS Coronavirus 2: NEGATIVE

## 2019-09-29 LAB — URINE CULTURE

## 2019-09-29 LAB — PROTIME-INR
INR: 1.1 (ref 0.8–1.2)
Prothrombin Time: 13.9 seconds (ref 11.4–15.2)

## 2019-09-29 LAB — PROCALCITONIN: Procalcitonin: 13.79 ng/mL

## 2019-09-29 LAB — LACTIC ACID, PLASMA: Lactic Acid, Venous: 2 mmol/L (ref 0.5–1.9)

## 2019-09-29 MED ORDER — SODIUM CHLORIDE 0.9 % IV SOLN
1.0000 g | INTRAVENOUS | Status: DC
  Administered 2019-09-29 – 2019-09-30 (×2): 1 g via INTRAVENOUS
  Filled 2019-09-29 (×2): qty 1
  Filled 2019-09-29: qty 10

## 2019-09-29 MED ORDER — CHLORHEXIDINE GLUCONATE CLOTH 2 % EX PADS
6.0000 | MEDICATED_PAD | Freq: Every day | CUTANEOUS | Status: DC
  Administered 2019-09-30: 6 via TOPICAL

## 2019-09-29 NOTE — Consult Note (Signed)
DO NOT MANIPULATE THIS PATIENT'S FOLEY CATHETER. IF YOU NEED ASSISTANCE WITH THIS CATHETER, PLEASE CONTACT UROLOGY.  Urology Consult  I have been asked to see the patient by Dr. Benjie Karvonen, for evaluation and management of possible sepsis.  Chief Complaint: AMS, rigors  History of Present Illness: Alexander Hogan is a 85 y.o. year old male who presented to the ED last night with rigors and altered mental status. He had been discharged earlier that day following removal of an artificial urinary sphincter due to pump erosion through the scrotum. Intraoperatively, he was found to have circumferential urethral erosion of the sphincter cuff, requiring Foley catheter placement over a wire. Patient received Bactrim preoperatively due to exposed scrotal foreign body, as well as intraoperative Bacitracin solution irrigation and discharged home on continued Bactrim. Admission antibiotics as below.  Lactate 3.2 on arrival yesterday, now 2.0. Creatinine 1.96 now, down from 2.19 on admission. WBC count 12.8 now, up from 10.7 on admission.  Blood cultures with no growth at <12 hours, urine microscopy with no bacteria visualized. Urine cultures pending.  Patient has been afebrile since admission. Tachycardia resolved. BP stable at 117/55.  Patient reports feeling well today with no acute concerns.  Anti-infectives (From admission, onward)   Start     Dose/Rate Route Frequency Ordered Stop   09/29/19 1030  cefTRIAXone (ROCEPHIN) 1 g in sodium chloride 0.9 % 100 mL IVPB     1 g 200 mL/hr over 30 Minutes Intravenous Every 24 hours 09/29/19 1016     09/29/19 0630  aztreonam (AZACTAM) 2 g in sodium chloride 0.9 % 100 mL IVPB     2 g 200 mL/hr over 30 Minutes Intravenous  Once 09/28/19 2354 09/29/19 0800   09/28/19 2215  ceFEPIme (MAXIPIME) 1 g in sodium chloride 0.9 % 100 mL IVPB     1 g 200 mL/hr over 30 Minutes Intravenous  Once 09/28/19 2205 09/28/19 2300     Past Medical History:  Diagnosis Date  .  Arthritis   . Gout   . Heart murmur   . High blood pressure   . History of kidney stones    h/o  . Prostate cancer ([place])   . Urinary retention     Past Surgical History:  Procedure Laterality Date  . PROSTATECTOMY  1999  . TOTAL KNEE ARTHROPLASTY Bilateral 2005-2008   x2  . URINARY SPHINCTER IMPLANT     x2    Home Medications:  Current Meds  Medication Sig  . acetaminophen (TYLENOL) 500 MG tablet Take 500 mg by mouth every 6 (six) hours as needed (for pain.).  Marland Kitchen atorvastatin (LIPITOR) 80 MG tablet Take 80 mg by mouth daily at 6 PM.   . Cholecalciferol (VITAMIN D3) 50 MCG (2000 UT) TABS Take 2,000 Units by mouth every evening.  . hyoscyamine (LEVSIN SL) 0.125 MG SL tablet Place 0.125-0.25 mg under the tongue every 4 (four) hours as needed (abdominal cramping.).   Marland Kitchen losartan (COZAAR) 50 MG tablet Take 50 mg by mouth every evening.   . Meth-Hyo-M Bl-Na Phos-Ph Sal (URIBEL) 118 MG CAPS Take 1 capsule (118 mg total) by mouth daily as needed (up to 4 times a day). (Patient taking differently: Take 118 mg by mouth 4 (four) times daily as needed (pain/urinary issues.). )  . metoprolol succinate (TOPROL-XL) 25 MG 24 hr tablet Take 12.5 mg by mouth daily.   . mupirocin ointment (BACTROBAN) 2 % Apply 1 application topically 2 (two) times daily as needed (skin abrasions.).  Marland Kitchen  nystatin (MYCOSTATIN/NYSTOP) powder Apply topically 4 (four) times daily. (Patient taking differently: Apply 1 g topically 4 (four) times daily as needed (metastatic melanoma). )  . potassium chloride (K-DUR) 10 MEQ tablet Take 10 mEq by mouth daily.  Marland Kitchen sulfamethoxazole-trimethoprim (BACTRIM DS) 800-160 MG tablet Take 1 tablet by mouth every 12 (twelve) hours.  . torsemide (DEMADEX) 20 MG tablet Take 20 mg by mouth daily.   Marland Kitchen triamcinolone ointment (KENALOG) 0.5 % Apply 1 application topically 2 (two) times daily. (Patient taking differently: Apply 1 application topically 2 (two) times daily as needed (metastatic  melanoma). )  . trimethoprim (TRIMPEX) 100 MG tablet Take 1 tablet (100 mg total) by mouth daily. Please begin the suppressive dose after you have completed the entire treatment course of bactrim    Allergies:  Allergies  Allergen Reactions  . Hydrocodone Other (See Comments)    confusion  . Penicillins Rash    Did it involve swelling of the face/tongue/throat, SOB, or low BP? No Did it involve sudden or severe rash/hives, skin peeling, or any reaction on the inside of your mouth or nose? No Did you need to seek medical attention at a hospital or doctor's office? No When did it last happen? 1948 If all above answers are "NO", may proceed with cephalosporin use.     Family History  Problem Relation Age of Onset  . Bladder Cancer Mother 75  . Kidney cancer Father 20  . Heart attack Father   . Heart Problems Sister     Social History:  reports that he has never smoked. He has never used smokeless tobacco. He reports that he does not drink alcohol or use drugs.  ROS: A complete review of systems was performed.  All systems are negative except for pertinent findings as noted.  Physical Exam:  Vital signs in last 24 hours: Temp:  [97 F (36.1 C)-98.6 F (37 C)] 98.5 F (36.9 C) (10/27 0544) Pulse Rate:  [79-114] 88 (10/27 0544) Resp:  [15-30] 18 (10/27 0544) BP: (106-164)/(49-104) 117/55 (10/27 0544) SpO2:  [93 %-100 %] 97 % (10/27 0544) Weight:  [87.1 kg-88 kg] 87.1 kg (10/27 0130) Constitutional:  Awake and alert, no acute distress HEENT: [place] AT, moist mucus membranes Cardiovascular: No clubbing, cyanosis, or edema. Respiratory: Normal respiratory effort GI: Abdomen is soft, nontender, nondistended, no abdominal masses GU: Open scrotal wound with iodoform packing in place, slight bloody discharge visible on overlying gauze. Wound is clean and dry, Foley catheter in place. No erythema or edema of the penis or scrotum noted. Skin: No rashes, bruises or suspicious  lesions Neurologic: Grossly intact, no focal deficits, moving all 4 extremities Psychiatric: Normal mood and affect  Laboratory Data:  Recent Labs    09/28/19 2208 09/29/19 0423  WBC 10.7* 12.8*  HGB 12.0* 10.4*  HCT 37.8* 32.9*   Recent Labs    09/28/19 2208 09/29/19 0423  NA 137 138  K 4.3 4.6  CL 103 108  CO2 22 23  GLUCOSE 123* 135*  BUN 29* 26*  CREATININE 2.19* 1.96*  CALCIUM 8.7* 8.3*   Recent Labs    09/28/19 2208 09/29/19 0423  INR 1.1 1.1   Urinalysis    Component Value Date/Time   COLORURINE YELLOW (A) 09/28/2019 2208   APPEARANCEUR CLOUDY (A) 09/28/2019 2208   APPEARANCEUR Cloudy (A) 12/25/2018 1536   LABSPEC 1.015 09/28/2019 2208   PHURINE 6.0 09/28/2019 [place] 09/28/2019 2208   HGBUR LARGE (A) 09/28/2019 2208  BILIRUBINUR NEGATIVE 09/28/2019 2208   BILIRUBINUR Negative 12/25/2018 [place] 09/28/2019 2208   PROTEINUR 100 (A) 09/28/2019 2208   NITRITE NEGATIVE 09/28/2019 2208   LEUKOCYTESUR LARGE (A) 09/28/2019 2208   Results for orders placed or performed during the hospital encounter of 09/28/19  Blood Culture (routine x 2)     Status: None (Preliminary result)   Collection Time: 09/28/19 10:08 PM   Specimen: BLOOD  Result Value Ref Range Status   Specimen Description BLOOD LEFT ASSIST CONTROL  Final   Special Requests   Final    BOTTLES DRAWN AEROBIC AND ANAEROBIC Blood Culture results may not be optimal due to an excessive volume of blood received in culture bottles   Culture  Setup Time NO ORGANISMS SEEN  Final   Culture   Final    NO GROWTH < 12 HOURS Performed at [facility], [address] [place], [place] [address]    Report Status PENDING  Incomplete  Blood Culture (routine x 2)     Status: None (Preliminary result)   Collection Time: 09/28/19 10:13 PM   Specimen: BLOOD  Result Value Ref Range Status   Specimen Description BLOOD RIGHT ASSIST CONTROL  Final   Special Requests    Final    BOTTLES DRAWN AEROBIC AND ANAEROBIC Blood Culture adequate volume   Culture   Final    NO GROWTH < 12 HOURS Performed at [facility], [address], [place], [place] [address]    Report Status PENDING  Incomplete   Assessment & Plan:  85 year old male readmitted with concerns for sepsis following same day discharge after artificial urinary sphincter explant with pump erosion through the scrotum and circumferential coverage into the urethra.  Lactate improving on antibiotics.  While it is possible this patient has a UTI, his current presentation is more likely due to transient bacteremia due to disruption of biofilm in the setting of removal of an eroded and infected artificial urinary sphincter.  Recommend discharge due to rapid clinical improvement with plans for outpatient urologic management of now indwelling Foley catheter to promote healing of the obliterated bulbar urethra.  Patient to complete previously prescribed course of Bactrim as an outpatient.  DO NOT MANIPULATE THIS PATIENT'S FOLEY CATHETER. IF YOU NEED ASSISTANCE WITH THIS CATHETER, PLEASE CONTACT UROLOGY.  Thank you for involving me in this patient's care, I will continue to follow along.  Debroah Loop, PA-C 09/29/2019 8:32 AM

## 2019-09-29 NOTE — Plan of Care (Signed)
  Problem: Education: Goal: Knowledge of General Education information will improve Description: Including pain rating scale, medication(s)/side effects and non-pharmacologic comfort measures Outcome: Progressing   Problem: Safety: Goal: Ability to remain free from injury will improve Outcome: Progressing   

## 2019-09-29 NOTE — Progress Notes (Signed)
[place] at [place] NAME: Alexander Hogan    MR#:  [id number]  DATE OF BIRTH:  May 12, 1922  SUBJECTIVE:  No acute issues this am patient eating breakfast   REVIEW OF SYSTEMS:    Review of Systems  Constitutional: Negative for fever, chills weight loss HENT: Negative for ear pain, nosebleeds, congestion, facial swelling, rhinorrhea, neck pain, neck stiffness and ear discharge.   Respiratory: Negative for cough, shortness of breath, wheezing  Cardiovascular: Negative for chest pain, palpitations and leg swelling.  Gastrointestinal: Negative for heartburn, abdominal pain, vomiting, diarrhea or consitpation Genitourinary: Negative for dysuria, urgency, frequency, hematuria Musculoskeletal: Negative for back pain or joint pain Neurological: Negative for dizziness, seizures, syncope, focal weakness,  numbness and headaches.  Hematological: Does not bruise/bleed easily.  Psychiatric/Behavioral: Negative for hallucinations, confusion, dysphoric mood    Tolerating Diet: yes      DRUG ALLERGIES:   Allergies  Allergen Reactions  . Hydrocodone Other (See Comments)    confusion  . Penicillins Rash    Did it involve swelling of the face/tongue/throat, SOB, or low BP? No Did it involve sudden or severe rash/hives, skin peeling, or any reaction on the inside of your mouth or nose? No Did you need to seek medical attention at a hospital or doctor's office? No When did it last happen? 1948 If all above answers are "NO", may proceed with cephalosporin use.     VITALS:  Blood pressure (!) 117/55, pulse 88, temperature 98.5 F (36.9 C), resp. rate 18, height 6' (1.829 m), weight 87.1 kg, SpO2 97 %.  PHYSICAL EXAMINATION:  Constitutional: Appears well-developed and well-nourished. No distress. HENT: Normocephalic. Marland Kitchen Oropharynx is clear and moist.  Eyes: Conjunctivae and EOM are normal. PERRLA, no scleral icterus.  Neck: Normal ROM. Neck  supple. No JVD. No tracheal deviation. CVS: RRR, S1/S2 +, no murmurs, no gallops, no carotid bruit.  Pulmonary: Effort and breath sounds normal, no stridor, rhonchi, wheezes, rales.  Abdominal: Soft. BS +,  no distension, tenderness, rebound or guarding.  Musculoskeletal: Normal range of motion. No edema and no tenderness.  Neuro: Alert. CN 2-12 grossly intact. No focal deficits. Skin: Skin is warm and dry. No rash noted. Psychiatric: Normal mood and affect.      LABORATORY PANEL:   CBC Recent Labs  Lab 09/29/19 0423  WBC 12.8*  HGB 10.4*  HCT 32.9*  PLT 123*   ------------------------------------------------------------------------------------------------------------------  Chemistries  Recent Labs  Lab 09/28/19 2208 09/29/19 0423  NA 137 138  K 4.3 4.6  CL 103 108  CO2 22 23  GLUCOSE 123* 135*  BUN 29* 26*  CREATININE 2.19* 1.96*  CALCIUM 8.7* 8.3*  AST 35  --   ALT 30  --   ALKPHOS 84  --   BILITOT 0.8  --    ------------------------------------------------------------------------------------------------------------------  Cardiac Enzymes No results for input(s): TROPONINI in the last 168 hours. ------------------------------------------------------------------------------------------------------------------  RADIOLOGY:  Dg Chest Port 1 View  Result Date: 09/28/2019 CLINICAL DATA:  Sepsis workup EXAM: PORTABLE CHEST 1 VIEW COMPARISON:  None. FINDINGS: Cardiac shadows within normal limits. Aortic calcifications are noted. The lungs are hypoinflated but clear. No bony abnormality is seen. IMPRESSION: No acute abnormality noted. Electronically Signed   By: Inez Catalina M.D.   On: 09/28/2019 22:42     ASSESSMENT AND PLAN:   85 y.o. male with pertinent past medical history of Prostate cancer status post radical prostatectomy in 1999 with bilateral pelvic lymph node dissection followed  by artificial urinary sphincter prosthesis, heart murmur,  hypertension,malignant melanoma of right lower leg, CKD, CAD, Dementia, anemia of chronic disease, and hyperlipidemia presenting with altered mental status.  1. Sepsis: Patient presented with tachycardia and tachypnea. Sepsis is from UTI.  Covid 19 pending Change to Rocephin (Rash with PCN) Lactic Acid improving   2. History of Prostate cancer  s/p prostatectomy complicated by post prostatectomy incontinence requiring placement of an artificial urinary sphincter POD #1 Explant of ARTIFICIAL URINARY SPHINCTER  Urology consult pending for today KEEP FOLEY IN PLACE  3. Acute on chronic CKD Stage 3 Continue IV fluids hydration  4. Essential Hypertension  Continue metoprolol  5. HLD  Atorvastatin 80mg  PO qhs   PT d/c plan   Management plans discussed with the patient and he is in agreement.  CODE STATUS: DNR  TOTAL TIME TAKING CARE OF THIS PATIENT: 30 minutes.     POSSIBLE D/C 1-2 days, DEPENDING ON CLINICAL CONDITION.   Bettey Costa M.D on 09/29/2019 at 10:12 AM  Between 7am to 6pm - Pager - [phone] After 6pm go to [email or web address] - password [email or web address] [facility]  Office  [phone]  CC: Primary care physician; Dion Body, MD  Note: This dictation was prepared with Dragon dictation along with smaller phrase technology. Any transcriptional errors that result from this process are unintentional.

## 2019-09-29 NOTE — Progress Notes (Signed)
Referral for [facility] services received from [place]. Patient information faxed to referral. Will follow up  with family prior to discharge tomorrow.  Flo Shanks BSN, RN, [place] [phone]

## 2019-09-29 NOTE — Progress Notes (Signed)
CODE SEPSIS - PHARMACY COMMUNICATION  **Broad Spectrum Antibiotics should be administered within 1 hour of Sepsis diagnosis**  Time Code Sepsis Called/Page Received: 2201  Antibiotics Ordered: Cefepime  Time of 1st antibiotic administration: 2227  Additional action taken by pharmacy: n/a   If necessary, Name of Provider/Nurse Contacted: n/a    Ena Dawley ,PharmD Clinical Pharmacist  09/29/2019  12:19 AM

## 2019-09-29 NOTE — Telephone Encounter (Signed)
Called patient's son to discuss patient's readmission overnight and updating of clinical status.  All of his questions were answered.  We also discussed intraoperative findings of urethral erosion and need for likely chronic Foley catheter.  He was satisfied and expressed understanding of the end of our conversation today.   Hollice Espy, MD

## 2019-09-29 NOTE — Telephone Encounter (Signed)
Mr. Nicasio Barlowe Son of pt. Called and wants a call back ASAP regarding his father who was admitted to hospital. [phone].   DPR contact number is [phone] instead of the one he gave.

## 2019-09-29 NOTE — TOC Initial Note (Signed)
Transition of Care [facility]) - Initial/Assessment Note    Patient Details  Name: Alexander Hogan MRN: [id number] Date of Birth: February 20, 1922  [facility] CM/SW Contact:    Beverly Sessions, RN Phone Number: 09/29/2019, 4:34 PM  Clinical Narrative:                 Patient admitted from home with sepsis from UTI  Wife at bedside.  They reside at [facility] independent.   Patient s/p sphincter removal on 10/26.  Patient was open with [facility].  Due to procedure hospice services had to be rescinded, and plan was to initiate again after surgery.    Wife states that patient has a WC, cane, rollator, hospital bed in the home.    Wife states they have the option pay out of pocket for PCS services.   PT has assessed patient and recommends SNF.  At this time patient and husband decline SNF, they would like him to return home with hospice services.  Santiago Glad with [facility].  PCP [facility]  Wife states she have been working with SW at [facility] to arrange in home care after discharge.  Message left for Seth Bake at [facility] to follow up  Expected Discharge Plan: Home w Hospice Care Barriers to Discharge: Continued Medical Work up   Patient Goals and CMS Choice Patient states their goals for this hospitalization and ongoing recovery are:: to go back home with hospice      Expected Discharge Plan and Services Expected Discharge Plan: [place]   Discharge Planning Services: CM Consult   Living arrangements for the past 2 months: [place]                                      Prior Living Arrangements/Services Living arrangements for the past 2 months: [place] Lives with:: Spouse Patient language and need for interpreter reviewed:: Yes Do you feel safe going back to the place where you live?: Yes      Need for Family Participation in Patient Care:  Yes (Comment) Care giver support system in place?: Yes (comment) Current home services: DME Criminal Activity/Legal Involvement Pertinent to Current Situation/Hospitalization: No - Comment as needed  Activities of Daily Living Home Assistive Devices/Equipment: Wheelchair ADL Screening (condition at time of admission) Patient's cognitive ability adequate to safely complete daily activities?: Yes Is the patient deaf or have difficulty hearing?: No Does the patient have difficulty seeing, even when wearing glasses/contacts?: No Does the patient have difficulty concentrating, remembering, or making decisions?: No Patient able to express need for assistance with ADLs?: Yes Does the patient have difficulty dressing or bathing?: Yes Independently performs ADLs?: No Communication: Independent Dressing (OT): Needs assistance Is this a change from baseline?: Pre-admission baseline Grooming: Needs assistance Is this a change from baseline?: Pre-admission baseline Feeding: Independent Bathing: Needs assistance Is this a change from baseline?: Pre-admission baseline Toileting: Needs assistance Is this a change from baseline?: Pre-admission baseline In/Out Bed: Needs assistance Is this a change from baseline?: Pre-admission baseline Walks in Home: Needs assistance Is this a change from baseline?: Pre-admission baseline Does the patient have difficulty walking or climbing stairs?: Yes Weakness of Legs: Both Weakness of Arms/Hands: None  Permission Sought/Granted                  Emotional Assessment  Appearance:: Appears stated age     Orientation: : Oriented to Self, Oriented to Place   Psych Involvement: No (comment)  Admission diagnosis:  Urinary tract infection with hematuria, site unspecified [N39.0, R31.9] Sepsis, due to unspecified organism, unspecified whether acute organ dysfunction present [facility]) [A41.9] Patient Active Problem List   Diagnosis Date Noted  . Sepsis due to  urinary tract infection ([place]) 09/28/2019  . Anemia of chronic disease 11/19/2018  . Goals of care, counseling/discussion 08/26/2018  . Melanoma of skin ([place]) 08/26/2018  . Personal history of prostate cancer 10/08/2017  . Stress incontinence of urine 10/08/2017  . Chronic kidney disease, stage 4, severely decreased GFR ([facility]) 05/20/2016  . Hyperlipidemia 05/20/2016  . Lumbar radiculitis 09/24/2014  . Bilateral knee pain 08/03/2014  . Dementia ([place]) 06/07/2014  . History of coronary artery disease 06/07/2014  . History of lymphoma 09/07/2012  . Coronary artery disease 07/21/2012  . Essential (primary) hypertension 07/21/2012  . Pure hypercholesterolemia 07/21/2012   PCP:  Dion Body, MD Pharmacy:   [facility] [place], [place] - [place] [address] [place] [address] Phone: [phone] Fax: [phone]  [place], [place] [address] Phone: [phone] Fax: [phone]     Social Determinants of Health (SDOH) Interventions    Readmission Risk Interventions No flowsheet data found.

## 2019-09-29 NOTE — H&P (Addendum)
[facility] NAME: Alexander Hogan    MR#:  [id number]  DATE OF BIRTH:  25-Nov-1922  DATE OF ADMISSION:  09/28/2019  PRIMARY CARE PHYSICIAN: Dion Body, MD   REQUESTING/REFERRING PHYSICIAN: Conni Slipper, MD  CHIEF COMPLAINT:   Chief Complaint  Patient presents with  . Altered Mental Status    HISTORY OF PRESENT ILLNESS:  85 y.o. male with pertinent past medical history of state cancer status post radical prostatectomy in 1999 with bilateral pelvic lymph node dissection followed by artificial urinary sphincter prosthesis, heart murmur, hypertension,malignant melanoma of right lower leg, CKD, CAD, Dementia, anemia of chronic disease, and hyperlipidemia presenting with altered mental status.  Patient was seen by urology on 10/21 2020 for evaluation of possible relation of his facial sphincter through his scrotum.  He reported noticing a component of his artificial sphincter protruding through his scrotal tissue.  Went explant of artificial urinary sphincter on 09/28/2019 with the urologist Dr. Hollice Espy.  Per patient's family, patient developed altered mental status and shaking following removal of the artificial urethral catheter.  Family reports tachycardia and hypotension without associated fevers or chills, hematuria, nausea or vomiting or any other symptoms.  He was brought to the ED for further evaluation.   On arrival to the ED, he was afebrile with blood pressure 126/58 mm Hg and pulse rate 112 beats/min. There were no focal neurological deficits; he was alert and oriented x3. Initial labs revealed lactic acid of 3.2, BUN 29, Cr 2.19, WBC 10.7, platelets 130. UA show evidence of UTI. Chest xray shows no acute abnormality. Code sepsis initiated and patient was started on IV abx. He will be admitted under hospitalist for further management.  PAST MEDICAL HISTORY:   Past Medical History:  Diagnosis Date  . Arthritis    . Gout   . Heart murmur   . High blood pressure   . History of kidney stones    h/o  . Prostate cancer ([place])   . Urinary retention     PAST SURGICAL HISTORY:   Past Surgical History:  Procedure Laterality Date  . PROSTATECTOMY  1999  . TOTAL KNEE ARTHROPLASTY Bilateral 2005-2008   x2  . URINARY SPHINCTER IMPLANT     x2    SOCIAL HISTORY:   Social History   Tobacco Use  . Smoking status: Never Smoker  . Smokeless tobacco: Never Used  Substance Use Topics  . Alcohol use: Never    Frequency: Never    FAMILY HISTORY:   Family History  Problem Relation Age of Onset  . Bladder Cancer Mother 23  . Kidney cancer Father 78  . Heart attack Father   . Heart Problems Sister     DRUG ALLERGIES:   Allergies  Allergen Reactions  . Hydrocodone Other (See Comments)    confusion  . Penicillins Rash    Did it involve swelling of the face/tongue/throat, SOB, or low BP? No Did it involve sudden or severe rash/hives, skin peeling, or any reaction on the inside of your mouth or nose? No Did you need to seek medical attention at a hospital or doctor's office? No When did it last happen? 1948 If all above answers are "NO", may proceed with cephalosporin use.     REVIEW OF SYSTEMS:   Review of Systems  Unable to perform ROS: Mental status change   MEDICATIONS AT HOME:   Prior to Admission medications   Medication Sig Start Date End Date  Taking? Authorizing Provider  acetaminophen (TYLENOL) 500 MG tablet Take 500 mg by mouth every 6 (six) hours as needed (for pain.).   Yes [provider]  atorvastatin (LIPITOR) 80 MG tablet Take 80 mg by mouth daily at 6 PM.  04/20/16  Yes [provider]  Cholecalciferol (VITAMIN D3) 50 MCG (2000 UT) TABS Take 2,000 Units by mouth every evening.   Yes [provider]  hyoscyamine (LEVSIN SL) 0.125 MG SL tablet Place 0.125-0.25 mg under the tongue every 4 (four) hours as needed (abdominal cramping.).  04/10/16   Yes [provider]  losartan (COZAAR) 50 MG tablet Take 50 mg by mouth every evening.  08/06/18  Yes [provider]  Meth-Hyo-M Bl-Na Phos-Ph Sal (URIBEL) 118 MG CAPS Take 1 capsule (118 mg total) by mouth daily as needed (up to 4 times a day). Patient taking differently: Take 118 mg by mouth 4 (four) times daily as needed (pain/urinary issues.).  03/09/19  Yes Stoioff, Ronda Fairly, MD  metoprolol succinate (TOPROL-XL) 25 MG 24 hr tablet Take 12.5 mg by mouth daily.  05/01/16  Yes [provider]  mupirocin ointment (BACTROBAN) 2 % Apply 1 application topically 2 (two) times daily as needed (skin abrasions.).   Yes [provider]  nystatin (MYCOSTATIN/NYSTOP) powder Apply topically 4 (four) times daily. Patient taking differently: Apply 1 g topically 4 (four) times daily as needed (metastatic melanoma).  11/10/18  Yes Billey Co, MD  potassium chloride (K-DUR) 10 MEQ tablet Take 10 mEq by mouth daily. 08/06/18  Yes [provider]  sulfamethoxazole-trimethoprim (BACTRIM DS) 800-160 MG tablet Take 1 tablet by mouth every 12 (twelve) hours. 09/23/19  Yes McGowan, Larene Beach A, PA-C  torsemide (DEMADEX) 20 MG tablet Take 20 mg by mouth daily.  08/06/18  Yes [provider]  triamcinolone ointment (KENALOG) 0.5 % Apply 1 application topically 2 (two) times daily. Patient taking differently: Apply 1 application topically 2 (two) times daily as needed (metastatic melanoma).  12/25/18  Yes Billey Co, MD  trimethoprim (TRIMPEX) 100 MG tablet Take 1 tablet (100 mg total) by mouth daily. Please begin the suppressive dose after you have completed the entire treatment course of bactrim 09/28/19  Yes Hollice Espy, MD      VITAL SIGNS:  Blood pressure 139/65, pulse (!) 105, temperature 98.6 F (37 C), temperature source Oral, resp. rate 20, height 6' (1.829 m), weight 87.1 kg, SpO2 97 %.  PHYSICAL EXAMINATION:   Physical Exam  GENERAL:  85  y.o.-year-old patient lying in the bed with no acute distress.  EYES: Pupils equal, round, reactive to light and accommodation. No scleral icterus. Extraocular muscles intact.  HEENT: Head atraumatic, normocephalic. Oropharynx and nasopharynx clear.  NECK:  Supple, no jugular venous distention. No thyroid enlargement, no tenderness.  LUNGS: Normal breath sounds bilaterally, no wheezing, rales,rhonchi or crepitation. No use of accessory muscles of respiration.  CARDIOVASCULAR: S1, S2 normal. Murmur present, rubs, or gallops.  ABDOMEN: Soft, nontender, nondistended. Bowel sounds present. No organomegaly or mass.  EXTREMITIES:Bilateral edema of lower extremities, cyanosis, or clubbing.  NEUROLOGIC: Cranial nerves II through XII are intact. Muscle strength 5/5 in all extremities. Sensation intact. Gait not checked.  PSYCHIATRIC: The patient is alert and oriented x 2.  SKIN: No obvious rash, lesion, or ulcer.   DATA REVIEWED:  LABORATORY PANEL:   CBC Recent Labs  Lab 09/28/19 2208  WBC 10.7*  HGB 12.0*  HCT 37.8*  PLT 130*   ------------------------------------------------------------------------------------------------------------------  Chemistries  Recent Labs  Lab 09/28/19 2208  NA 137  K 4.3  CL 103  CO2 22  GLUCOSE 123*  BUN 29*  CREATININE 2.19*  CALCIUM 8.7*  AST 35  ALT 30  ALKPHOS 84  BILITOT 0.8   ------------------------------------------------------------------------------------------------------------------  Cardiac Enzymes No results for input(s): TROPONINI in the last 168 hours. ------------------------------------------------------------------------------------------------------------------  RADIOLOGY:  Dg Chest Port 1 View  Result Date: 09/28/2019 CLINICAL DATA:  Sepsis workup EXAM: PORTABLE CHEST 1 VIEW COMPARISON:  None. FINDINGS: Cardiac shadows within normal limits. Aortic calcifications are noted. The lungs are hypoinflated but clear. No bony  abnormality is seen. IMPRESSION: No acute abnormality noted. Electronically Signed   By: Inez Catalina M.D.   On: 09/28/2019 22:42    EKG:  EKG: unchanged from previous tracings, sinus tachycardia.  IMPRESSION AND PLAN:   85 y.o. male with pertinent past medical history of Prostate cancer status post radical prostatectomy in 1999 with bilateral pelvic lymph node dissection followed by artificial urinary sphincter prosthesis, heart murmur, hypertension,malignant melanoma of right lower leg, CKD, CAD, Dementia, anemia of chronic disease, and hyperlipidemia presenting with altered mental status.  1. Sepsis - Patient meets SIRS criteria,Heart Rate 112 beats/minute, Respiratory Rate 26 breaths/minute, with lactic acidosis and leukocytosis - Suspect UTI as source of infection - Chest x-ray shows  - Lactic acid - Covid 19 pending - Check procalcitonin - UA shows evidence of UTI - Urine cultures pending - Blood cultures pending - Start Empiric abx - IVFs and PRN bolus to keep MAP<80mmHg or SBP <23mmHg  2. History of Prostate cancer  s/p prostatectomy complicated by post prostatectomy incontinence requiring placement of an artificial urinary sphincter - Now with sphincter erosion s/p explant on 09/28/19  - Presenting with altered mental status following procedures found to be septic in the setting of UTI - Start empiric antibiotics as above - Urine cultures pending - Urinary catheter in place - Urology consult placed  3. Acute on chronic CKD - BUN/creatinine elevated likely post renal - Hold nephrotoxins - Monitor renal function - IV fluids hydration  4. Hypertension - Losartan in the setting of AKI - Continue metoprolol  5. HLD  - Atorvastatin 80mg  PO qhs  6. DVT prophylaxis - Heparin SubQ    All the records are reviewed and case discussed with ED provider. Management plans discussed with the patient, family and they are in agreement.  CODE STATUS: DNR  TOTAL TIME TAKING  CARE OF THIS PATIENT: 50 minutes.    on 09/29/2019 at 3:12 AM   Rufina Falco, DNP, FNP-BC [facility] Nurse Practitioner Between 7am to 6pm - Pager [phone]  After 6pm go to [email or web address] - password [email or web address] [facility]  Office  [phone]  CC: Primary care physician; Dion Body, MD

## 2019-09-29 NOTE — Anesthesia Postprocedure Evaluation (Signed)
Anesthesia Post Note  Patient: Alexander Hogan  Procedure(s) Performed: Explant of ARTIFICIAL URINARY SPHINCTER (N/A ) CYSTOSCOPY FLEXABLE (N/A )  Anesthesia Type: General Level of consciousness: awake and alert and oriented Pain management: pain level controlled Vital Signs Assessment: post-procedure vital signs reviewed and stable Respiratory status: spontaneous breathing Cardiovascular status: blood pressure returned to baseline Anesthetic complications: no     Last Vitals:  Vitals:   09/28/19 1813 09/28/19 1831  BP: (!) 158/94   Pulse: 95   Resp: 16 18  Temp:  (!) 36.1 C  SpO2: 96% 95%    Last Pain:  Vitals:   09/28/19 1831  TempSrc:   PainSc: 4                  Taitum Menton

## 2019-09-29 NOTE — Evaluation (Signed)
Physical Therapy Evaluation Patient Details Name: Alexander Hogan MRN: [id number] DOB: 1922-03-20 Today's Date: 09/29/2019   History of Present Illness  Alexander Hogan is a 81yoM who comes to [facility] shortly after return to home following a urological procedure, as he was having weakness. Pt admitted with sepsis related to UTI. PTA pt AMB household distances with 4WW,  Clinical Impression  Pt admitted with above diagnosis. Pt currently with functional limitations due to the deficits listed below (see "PT Problem List"). Upon entry, pt in bed, awake and agreeable to participate.  The pt is alert and oriented to self but disoriented to place and situation. Author introduced self and reason for visit, and pt deflects author multiple several times, saying 'it's my wife that needs PT, I'm fine.' Pt believe to be in his home at one point, yelling at his wife to find his shorts in the closet. Pt reports scrotal discomfort s/p his procedure and is reluctant to do much with author. He is convinced to mobilize to EOB, but resistant to any physical assist even from his wife. Pt appears very weak, labored, and uncomfortable with mobility. Pt unable to sit upright at EOB without lateral trunk collapse. Most attempts at collecting history demonstrate limitations imposed by [facility] and AMS. Pt often tries to dismiss author by stating 'whatever it is you're trying to sell me, I'm not interested in buying anything." Additional mobility is refused as patient has discomfort from procure and limited insight at this time. Functional mobility assessment demonstrates increased effort/time requirements, poor tolerance, and definite need for physical assistance, whereas the patient performed these at a higher level of independence PTA. Pt is also typically not with AMS as in current presentation. Pt will benefit from skilled PT intervention to increase independence and safety with basic mobility in preparation for discharge to the  venue listed below.       Follow Up Recommendations SNF;Supervision for mobility/OOB;Supervision/Assistance - 24 hour    Equipment Recommendations  Other (comment)(If patient were to DC today, would need a WC, elevated leg rests, WC cushion, mechanical lift, adn EMS transport back into home.)    Recommendations for Other Services       Precautions / Restrictions Precautions Precautions: Fall Precaution Comments: Has altered mentation at evaluation Restrictions Weight Bearing Restrictions: No      Mobility  Bed Mobility Overal bed mobility: Needs Assistance Bed Mobility: Supine to Sit;Sit to Supine     Supine to sit: Min guard Sit to supine: Max assist   General bed mobility comments: very labored, additional time required, pt resistance to assistance from [place] and wife. Ultimately unable to sit EOB unsupported. Needs MaxA to scoot up in bed.  Transfers Overall transfer level: (pt refusing, pain inhibited with low motivation and confusion.)                  Ambulation/Gait                Stairs            Wheelchair Mobility    Modified Rankin (Stroke Patients Only)       Balance Overall balance assessment: Needs assistance Sitting-balance support: Single extremity supported;Feet unsupported;Feet supported Sitting balance-Leahy Scale: Poor                                       Pertinent Vitals/Pain Pain Assessment: Faces Faces Pain Scale:  Hurts even more Pain Location: scrotal area pain, worse with mobility Pain Intervention(s): Limited activity within patient's tolerance;Monitored during session;Premedicated before session;Repositioned    Home Living Family/patient expects to be discharged to:: Private residence Living Arrangements: Spouse/significant other Available Help at Discharge: Family Type of Home: House Home Access: Stairs to enter Entrance Stairs-Rails: Can reach both Entrance Stairs-Number of Steps:  2 Home Layout: One level Home Equipment: King - 4 wheels;Shower seat - built in;Cane - single point;Hospital bed Additional Comments: just got a hospital bed recently    Prior Function Level of Independence: Needs assistance   Gait / Transfers Assistance Needed: household distance AMB only with 4WW.  ADL's / Homemaking Assistance Needed: typically supervisition to minA for bathing/dressing.        Hand Dominance        Extremity/Trunk Assessment   Upper Extremity Assessment Upper Extremity Assessment: Generalized weakness    Lower Extremity Assessment Lower Extremity Assessment: Generalized weakness       Communication   Communication: HOH  Cognition Arousal/Alertness: Awake/alert Behavior During Therapy: WFL for tasks assessed/performed Overall Cognitive Status: Within Functional Limits for tasks assessed                                        General Comments      Exercises     Assessment/Plan    PT Assessment Patient needs continued PT services  PT Problem List Decreased strength;Decreased activity tolerance;Decreased balance;Decreased mobility       PT Treatment Interventions DME instruction;Gait training;Stair training;Functional mobility training;Therapeutic activities;Therapeutic exercise;Balance training;Cognitive remediation;Patient/family education;Wheelchair mobility training    PT Goals (Current goals can be found in the Care Plan section)  Acute Rehab PT Goals Patient Stated Goal: regain independent mobility in home. PT Goal Formulation: With family Time For Goal Achievement: 10/13/19 Potential to Achieve Goals: Fair    Frequency Min 2X/week   Barriers to discharge Decreased caregiver support;Inaccessible home environment pt is +2 adn non ambulatory at this time, has stairs to enter home    Co-evaluation               AM-PAC PT "6 Clicks" Mobility  Outcome Measure Help needed turning from your back to your side  while in a flat bed without using bedrails?: A Little Help needed moving from lying on your back to sitting on the side of a flat bed without using bedrails?: A Little Help needed moving to and from a bed to a chair (including a wheelchair)?: A Lot Help needed standing up from a chair using your arms (e.g., wheelchair or bedside chair)?: A Lot Help needed to walk in hospital room?: Total Help needed climbing 3-5 steps with a railing? : Total 6 Click Score: 12    End of Session   Activity Tolerance: Patient limited by pain;Patient limited by fatigue;Treatment limited secondary to agitation Patient left: in bed;with family/visitor present;with nursing/sitter in room;with call bell/phone within reach Nurse Communication: Mobility status PT Visit Diagnosis: Muscle weakness (generalized) (M62.81);Difficulty in walking, not elsewhere classified (R26.2);Unsteadiness on feet (R26.81);Other abnormalities of gait and mobility (R26.89);Other symptoms and signs involving the nervous system (R29.898)    Time: 3710-6269 PT Time Calculation (min) (ACUTE ONLY): 24 min   Charges:   PT Evaluation $PT Eval High Complexity: 1 High          3:59 PM, 09/29/19 Etta Grandchild, PT, DPT Physical Therapist - [facility]  [phone] ([place])    Kensie Susman C 09/29/2019, 3:49 PM

## 2019-09-29 NOTE — ED Notes (Signed)
ED TO INPATIENT HANDOFF REPORT  ED Nurse Name and Phone #: Jamie Kato  S Name/Age/Gender [place] y.o. male Room/Bed: ED04A/ED04A  Code Status   Code Status: DNR  Home/SNF/Other Home Patient oriented to: self, place, time and situation Is this baseline? Yes   Triage Complete: Triage complete  Chief Complaint sepsis ems  Triage Note Pt arrived to [facility] ED via [facility]. Pt family states that he had procedure performed today with the urologist at [facility], sent home with urinary catheter, and began to have altered mental status and tremors.   Allergies Allergies  Allergen Reactions  . Hydrocodone Other (See Comments)    confusion  . Penicillins Rash    Did it involve swelling of the face/tongue/throat, SOB, or low BP? No Did it involve sudden or severe rash/hives, skin peeling, or any reaction on the inside of your mouth or nose? No Did you need to seek medical attention at a hospital or doctor's office? No When did it last happen? 1948 If all above answers are "NO", may proceed with cephalosporin use.     Level of Care/Admitting Diagnosis ED Disposition    ED Disposition Condition [facility] Area: [place] [[id number]]  Level of Care: Med-Surg [16]  Covid Evaluation: Asymptomatic Screening Protocol (No Symptoms)  Diagnosis: Sepsis due to urinary tract infection [facility]) [401027]  Admitting Physician: Eula Flax  Attending Physician: Rufina Falco ACHIENG [phone]  Estimated length of stay: past midnight tomorrow  Certification:: I certify this patient will need inpatient services for at least 2 midnights  PT Class (Do Not Modify): Inpatient [101]  PT Acc Code (Do Not Modify): Private [1]       B Medical/Surgery History Past Medical History:  Diagnosis Date  . Arthritis   . Gout   . Heart murmur   . High blood pressure   . History of kidney stones    h/o  . Prostate cancer ([place])    . Urinary retention    Past Surgical History:  Procedure Laterality Date  . PROSTATECTOMY  1999  . TOTAL KNEE ARTHROPLASTY Bilateral 2005-2008   x2  . URINARY SPHINCTER IMPLANT     x2     A IV Location/Drains/Wounds Patient Lines/Drains/Airways Status   Active Line/Drains/Airways    Name:   Placement date:   Placement time:   Site:   Days:   Peripheral IV 09/28/19 Left Antecubital   09/28/19    -    Antecubital   1   Peripheral IV 09/28/19 Right Antecubital   09/28/19    2210    Antecubital   1   Urethral Catheter Brett [facility]NA Double-lumen 16 Fr.   11/16/18    2040    Double-lumen   317   Incision (Closed) 09/28/19 Abdomen   09/28/19    1659     1   Incision (Closed) 09/28/19 Perineum   09/28/19    1659     1   Incision (Closed) 09/28/19 Scrotum   09/28/19    1659     1          Intake/Output Last 24 hours No intake or output data in the 24 hours ending 09/29/19 0022  Labs/Imaging Results for orders placed or performed during the hospital encounter of 09/28/19 (from the past 48 hour(s))  Lactic acid, plasma     Status: Abnormal   Collection Time: 09/28/19 10:08 PM  Result Value Ref Range  Lactic Acid, Venous 3.2 (HH) 0.5 - 1.9 mmol/L    Comment: CRITICAL RESULT CALLED TO, READ BACK BY AND VERIFIED WITH Luismanuel Corman MCCAULEY RN AT 2244 ON 09/28/2019 SNG Performed at [facility], [address] [place], [place] [address]   Comprehensive metabolic panel     Status: Abnormal   Collection Time: 09/28/19 10:08 PM  Result Value Ref Range   Sodium 137 135 - 145 mmol/L   Potassium 4.3 3.5 - 5.1 mmol/L   Chloride 103 98 - 111 mmol/L   CO2 22 22 - 32 mmol/L   Glucose, Bld 123 (H) 70 - 99 mg/dL   BUN 29 (H) 8 - 23 mg/dL   Creatinine, Ser 2.19 (H) 0.61 - 1.24 mg/dL   Calcium 8.7 (L) 8.9 - 10.3 mg/dL   Total Protein 6.5 6.5 - 8.1 g/dL   Albumin 3.5 3.5 - 5.0 g/dL   AST 35 15 - 41 U/L   ALT 30 0 - 44 U/L   Alkaline Phosphatase 84 38 - 126 U/L   Total Bilirubin 0.8 0.3 -  1.2 mg/dL   GFR calc non Af Amer 25 (L) >60 mL/min   GFR calc Af Amer 29 (L) >60 mL/min   Anion gap 12 5 - 15    Comment: Performed at [facility], [address] [place], [place] [address]  CBC WITH DIFFERENTIAL     Status: Abnormal   Collection Time: 09/28/19 10:08 PM  Result Value Ref Range   WBC 10.7 (H) 4.0 - 10.5 K/uL   RBC 4.30 4.22 - 5.81 MIL/uL   Hemoglobin 12.0 (L) 13.0 - 17.0 g/dL   HCT 37.8 (L) 39.0 - 52.0 %   MCV 87.9 80.0 - 100.0 fL   MCH 27.9 26.0 - 34.0 pg   MCHC 31.7 30.0 - 36.0 g/dL   RDW 15.7 (H) 11.5 - 15.5 %   Platelets 130 (L) 150 - 400 K/uL   nRBC 0.0 0.0 - 0.2 %   Neutrophils Relative % 93 %   Neutro Abs 10.0 (H) 1.7 - 7.7 K/uL   Lymphocytes Relative 3 %   Lymphs Abs 0.3 (L) 0.7 - 4.0 K/uL   Monocytes Relative 3 %   Monocytes Absolute 0.3 0.1 - 1.0 K/uL   Eosinophils Relative 0 %   Eosinophils Absolute 0.0 0.0 - 0.5 K/uL   Basophils Relative 0 %   Basophils Absolute 0.0 0.0 - 0.1 K/uL   Immature Granulocytes 1 %   Abs Immature Granulocytes 0.05 0.00 - 0.07 K/uL    Comment: Performed at [facility], [address] [place], [place] [address]  APTT     Status: None   Collection Time: 09/28/19 10:08 PM  Result Value Ref Range   aPTT 25 24 - 36 seconds    Comment: Performed at [facility], [address] [place], [place] [address]  Protime-INR     Status: None   Collection Time: 09/28/19 10:08 PM  Result Value Ref Range   Prothrombin Time 14.0 11.4 - 15.2 seconds   INR 1.1 0.8 - 1.2    Comment: (NOTE) INR goal varies based on device and disease states. Performed at [facility], [address] [place], [place] [address]   Urinalysis, Routine w reflex microscopic     Status: Abnormal   Collection Time: 09/28/19 10:08 PM  Result Value Ref Range   Color, Urine YELLOW (A) YELLOW   APPearance CLOUDY (A) CLEAR   Specific Gravity, Urine 1.015  1.005 - 1.030   pH 6.0 5.0 - 8.0   Glucose, UA NEGATIVE NEGATIVE  mg/dL   Hgb urine dipstick LARGE (A) NEGATIVE   Bilirubin Urine NEGATIVE NEGATIVE   Ketones, ur NEGATIVE NEGATIVE mg/dL   Protein, ur 100 (A) NEGATIVE mg/dL   Nitrite NEGATIVE NEGATIVE   Leukocytes,Ua LARGE (A) NEGATIVE   RBC / HPF >50 (H) 0 - 5 RBC/hpf   WBC, UA >50 (H) 0 - 5 WBC/hpf   Bacteria, UA NONE SEEN NONE SEEN   Squamous Epithelial / LPF NONE SEEN 0 - 5    Comment: Performed at [facility], [address] [place], [place] [address]   Dg Chest Port 1 View  Result Date: 09/28/2019 CLINICAL DATA:  Sepsis workup EXAM: PORTABLE CHEST 1 VIEW COMPARISON:  None. FINDINGS: Cardiac shadows within normal limits. Aortic calcifications are noted. The lungs are hypoinflated but clear. No bony abnormality is seen. IMPRESSION: No acute abnormality noted. Electronically Signed   By: Inez Catalina M.D.   On: 09/28/2019 22:42    Pending Labs Unresulted Labs (From admission, onward)    Start     Ordered   09/29/19 0500  Protime-INR  Tomorrow morning,   STAT     09/28/19 2354   09/29/19 0500  Procalcitonin  Tomorrow morning,   STAT     09/28/19 2354   09/29/19 0500  CBC  Tomorrow morning,   STAT     09/28/19 2354   09/29/19 1062  Basic metabolic panel  Tomorrow morning,   STAT     09/28/19 2354   09/28/19 2205  SARS CORONAVIRUS 2 (TAT 6-24 HRS) Nasopharyngeal Nasopharyngeal Swab  (Asymptomatic/Tier 2 Patients Labs)  ONCE - STAT,   STAT    Question Answer Comment  Is this test for diagnosis or screening Screening   Symptomatic for COVID-19 as defined by CDC No   Hospitalized for COVID-19 No   Admitted to ICU for COVID-19 No   Previously tested for COVID-19 Yes   Resident in a congregate (group) care setting No   Employed in healthcare setting No      09/28/19 2204   09/28/19 2203  Lactic acid, plasma  Now then every 2 hours,   STAT     09/28/19 2204   09/28/19 2203  Blood Culture (routine x 2)  BLOOD CULTURE X 2,   STAT     09/28/19 2204   09/28/19 2203  Urine culture  ONCE  - STAT,   STAT     09/28/19 2204          Vitals/Pain Today's Vitals   09/28/19 2300 09/28/19 2330 09/28/19 2330 09/29/19 0002  BP: (!) 126/58 (!) 111/49  117/82  Pulse: (!) 112 (!) 104  (!) 107  Resp: (!) 26 (!) 30  15  Temp:      TempSrc:      SpO2: 100% 99%  96%  Weight:      Height:      PainSc:  0-No pain 0-No pain     Isolation Precautions No active isolations  Medications Medications  acetaminophen (TYLENOL) tablet 500 mg (has no administration in time range)  atorvastatin (LIPITOR) tablet 80 mg (has no administration in time range)  metoprolol succinate (TOPROL-XL) 24 hr tablet 12.5 mg (has no administration in time range)  hyoscyamine (LEVSIN SL) SL tablet 0.125-0.25 mg (has no administration in time range)  Uribel 118 MG CAPS 118 mg (has no administration in time range)  Vitamin D3  TABS 2,000 Units (has no administration in time range)  potassium chloride (KLOR-CON) CR tablet 10 mEq (has no administration in time range)  mupirocin ointment (BACTROBAN) 2 % 1 application (has no administration in time range)  nystatin (MYCOSTATIN/NYSTOP) topical powder 1 g (has no administration in time range)  triamcinolone ointment (KENALOG) 0.5 % 1 application (has no administration in time range)  heparin injection 5,000 Units (has no administration in time range)  aztreonam (AZACTAM) 2 g in sodium chloride 0.9 % 100 mL IVPB (has no administration in time range)  ceFEPIme (MAXIPIME) 1 g in sodium chloride 0.9 % 100 mL IVPB (0 g Intravenous Stopped 09/28/19 2300)  sodium chloride 0.9 % bolus 1,000 mL (0 mLs Intravenous Stopped 09/29/19 0009)    Mobility walks Moderate fall risk   Focused Assessments N/A   R Recommendations: See Admitting Provider Note  Report given to:   Additional Notes: N/A

## 2019-09-30 DIAGNOSIS — A419 Sepsis, unspecified organism: Secondary | ICD-10-CM

## 2019-09-30 LAB — BASIC METABOLIC PANEL
Anion gap: 6 (ref 5–15)
BUN: 22 mg/dL (ref 8–23)
CO2: 23 mmol/L (ref 22–32)
Calcium: 8.2 mg/dL — ABNORMAL LOW (ref 8.9–10.3)
Chloride: 109 mmol/L (ref 98–111)
Creatinine, Ser: 1.64 mg/dL — ABNORMAL HIGH (ref 0.61–1.24)
GFR calc Af Amer: 41 mL/min — ABNORMAL LOW (ref >60)
GFR calc non Af Amer: 35 mL/min — ABNORMAL LOW (ref >60)
Glucose, Bld: 108 mg/dL — ABNORMAL HIGH (ref 70–99)
Potassium: 4.3 mmol/L (ref 3.5–5.1)
Sodium: 138 mmol/L (ref 135–145)

## 2019-09-30 NOTE — TOC Transition Note (Signed)
Transition of Care [facility]) - CM/SW Discharge Note   Patient Details  Name: MORGEN RITACCO MRN: [id number] Date of Birth: Jan 05, 1922  [facility] CM/SW Contact:  Beverly Sessions, RN Phone Number: 09/30/2019, 4:36 PM   Clinical Narrative:    Patient to discharge back to [facility] with hosp[facility] services today.  Santiago Glad with abdominal ultrasound notified  RNCM spoke with Charice the SW at [facility].  She states they are in the process of coordinating 24 hour care through [facility]. Currently they have services set up fom 7a-7p.  Orders for dressing changes faxed to [place].  Santiago Glad with [employer] Notified of dressing changes.    Patient to transport via EMS.  EMS packet and signed DNR on chart.  Bedside RN notified     Final next level of care: Home w Hospice Care Barriers to Discharge: Barriers Resolved   Patient Goals and CMS Choice Patient states their goals for this hospitalization and ongoing recovery are:: to go back home with hospice      Discharge Placement                Patient to be transferred to facility by: ems Name of family member notified: daughter Juliann Pulse Patient and family notified of of transfer: 09/30/19  Discharge Plan and Services   Discharge Planning Services: CM Consult                                 Social Determinants of Health (SDOH) Interventions     Readmission Risk Interventions No flowsheet data found.

## 2019-09-30 NOTE — Discharge Summary (Signed)
[place] [facility] [place] NAME: Alexander Hogan    MR#:  [id number]  DATE OF BIRTH:  15-Apr-1922  DATE OF ADMISSION:  09/28/2019 ADMITTING PHYSICIAN: Lang Snow, NP  DATE OF DISCHARGE: 09/30/2019  PRIMARY CARE PHYSICIAN: Dion Body, MD    ADMISSION DIAGNOSIS:  Urinary tract infection with hematuria, site unspecified [N39.0, R31.9] Sepsis, due to unspecified organism, unspecified whether acute organ dysfunction present ([place]) [A41.9]  DISCHARGE DIAGNOSIS:  Active Problems:   Sepsis due to urinary tract infection ([place])   SECONDARY DIAGNOSIS:   Past Medical History:  Diagnosis Date  . Arthritis   . Gout   . Heart murmur   . High blood pressure   . History of kidney stones    h/o  . Prostate cancer ([place])   . Urinary retention     HOSPITAL COURSE:  85 y.o.malewith pertinent past medical history ofProstatecancer status post radical prostatectomy in 1999 with bilateral pelvic lymph node dissection followed by artificial urinary sphincter prosthesis, heart murmur, hypertension,malignant melanoma of right lower leg, CKD, CAD, Dementia,anemia of chronic disease, and hyperlipidemiapresenting with altered mental status.  1.Sepsis: Patient presented with tachycardia and tachypnea. Sepsis is from UTI.  Covid19 negative. Urine culture suggests recollection. Patient without symptoms.  He will continue Bactrim as recommended by urology.  Sepsis is resolved   2. 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  Patient will follow up with urology in 1 to 2 weeks. On discharge, remove  approximately 2 inches of iodoform packing from his scrotal incision.  On discharge, plan to continue this daily until packing is completely removed.  3.Acute on chronic CKD Stage 3 Creatinine has improved with IV fluids.  4.Essential Hypertension Continue metoprolol  5.HLD  Atorvastatin 80mg  PO qhs   DISCHARGE CONDITIONS AND DIET:   Stable for discharge regular diet  CONSULTS OBTAINED:  Treatment Team:  Billey Co, MD  DRUG ALLERGIES:   Allergies  Allergen Reactions  . Hydrocodone Other (See Comments)    confusion  . Penicillins Rash    Did it involve swelling of the face/tongue/throat, SOB, or low BP? No Did it involve sudden or severe rash/hives, skin peeling, or any reaction on the inside of your mouth or nose? No Did you need to seek medical attention at a hospital or doctor's office? No When did it last happen? 1948 If all above answers are "NO", may proceed with cephalosporin use.     DISCHARGE MEDICATIONS:   Allergies as of 09/30/2019      Reactions   Hydrocodone Other (See Comments)   confusion   Penicillins Rash   Did it involve swelling of the face/tongue/throat, SOB, or low BP? No Did it involve sudden or severe rash/hives, skin peeling, or any reaction on the inside of your mouth or nose? No Did you need to seek medical attention at a hospital or doctor's office? No When did it last happen? 1948 If all above answers are "NO", may proceed with cephalosporin use.      Medication List    TAKE these medications   acetaminophen 500 MG tablet Commonly known as: TYLENOL Take 500 mg by mouth every 6 (six) hours as needed (for pain.).  atorvastatin 80 MG tablet Commonly known as: LIPITOR Take 80 mg by mouth daily at 6 PM.   hyoscyamine 0.125 MG SL tablet Commonly known as: LEVSIN SL Place 0.125-0.25 mg under the tongue every 4 (four) hours as needed (abdominal cramping.).   losartan 50 MG tablet Commonly known as:  COZAAR Take 50 mg by mouth every evening.   metoprolol succinate 25 MG 24 hr tablet Commonly known as: TOPROL-XL Take 12.5 mg by mouth daily.   mupirocin ointment 2 % Commonly known as: BACTROBAN Apply 1 application topically 2 (two) times daily as needed (skin abrasions.).   nystatin powder Commonly known as: MYCOSTATIN/NYSTOP Apply topically 4 (four) times daily. What changed:   how much to take  when to take this  reasons to take this   potassium chloride 10 MEQ tablet Commonly known as: KLOR-CON Take 10 mEq by mouth daily.   sulfamethoxazole-trimethoprim 800-160 MG tablet Commonly known as: BACTRIM DS Take 1 tablet by mouth every 12 (twelve) hours.   torsemide 20 MG tablet Commonly known as: DEMADEX Take 20 mg by mouth daily.   triamcinolone ointment 0.5 % Commonly known as: KENALOG Apply 1 application topically 2 (two) times daily. What changed:   when to take this  reasons to take this   trimethoprim 100 MG tablet Commonly known as: TRIMPEX Take 1 tablet (100 mg total) by mouth daily. Please begin the suppressive dose after you have completed the entire treatment course of bactrim   Uribel 118 MG Caps Take 1 capsule (118 mg total) by mouth daily as needed (up to 4 times a day). What changed:   when to take this  reasons to take this   Vitamin D3 50 MCG (2000 UT) Tabs Take 2,000 Units by mouth every evening.         Today   CHIEF COMPLAINT:  No acute events overnight.  Patient will be discharged home with hospice   VITAL SIGNS:  Blood pressure (!) 144/78, pulse 81, temperature 98.6 F (37 C), temperature source Oral, resp. rate 16, height 6' (1.829 m), weight 87.1 kg, SpO2 98 %.   REVIEW OF SYSTEMS:  Review of Systems  Constitutional: Negative.  Negative for chills, fever and malaise/fatigue.  HENT: Negative.  Negative for ear discharge, ear pain, hearing loss, nosebleeds and sore throat.   Eyes: Negative.  Negative for blurred  vision and pain.  Respiratory: Negative.  Negative for cough, hemoptysis, shortness of breath and wheezing.   Cardiovascular: Negative.  Negative for chest pain, palpitations and leg swelling.  Gastrointestinal: Negative.  Negative for abdominal pain, blood in stool, diarrhea, nausea and vomiting.  Genitourinary: Negative.  Negative for dysuria.  Musculoskeletal: Negative.  Negative for back pain.  Skin: Negative.   Neurological: Negative for dizziness, tremors, speech change, focal weakness, seizures and headaches.  Endo/Heme/Allergies: Negative.  Does not bruise/bleed easily.  Psychiatric/Behavioral: Positive for memory loss. Negative for depression, hallucinations and suicidal ideas.     PHYSICAL EXAMINATION:  GENERAL:  85 y.o.-year-old patient lying in the bed with no acute distress.  NECK:  Supple, no jugular venous distention. No thyroid enlargement, no tenderness.  LUNGS: Normal breath sounds bilaterally, no wheezing, rales,rhonchi  No use of accessory muscles of respiration.  CARDIOVASCULAR: S1, S2 normal. No murmurs, rubs, or gallops.  ABDOMEN: Soft, non-tender, non-distended. Bowel sounds present. No organomegaly or mass.  EXTREMITIES: No pedal edema, cyanosis, or clubbing.  PSYCHIATRIC: The patient is alert and oriented x 3.  SKIN: No obvious rash, lesion, or  ulcer.   DATA REVIEW:   CBC Recent Labs  Lab 09/29/19 0423  WBC 12.8*  HGB 10.4*  HCT 32.9*  PLT 123*    Chemistries  Recent Labs  Lab 09/28/19 2208  09/30/19 0407  NA 137   < > 138  K 4.3   < > 4.3  CL 103   < > 109  CO2 22   < > 23  GLUCOSE 123*   < > 108*  BUN 29*   < > 22  CREATININE 2.19*   < > 1.64*  CALCIUM 8.7*   < > 8.2*  AST 35  --   --   ALT 30  --   --   ALKPHOS 84  --   --   BILITOT 0.8  --   --    < > = values in this interval not displayed.    Cardiac Enzymes No results for input(s): TROPONINI in the last 168 hours.  Microbiology Results  @[email or web address] @  RADIOLOGY:  Dg Chest  Port 1 View  Result Date: 09/28/2019 CLINICAL DATA:  Sepsis workup EXAM: PORTABLE CHEST 1 VIEW COMPARISON:  None. FINDINGS: Cardiac shadows within normal limits. Aortic calcifications are noted. The lungs are hypoinflated but clear. No bony abnormality is seen. IMPRESSION: No acute abnormality noted. Electronically Signed   By: Inez Catalina M.D.   On: 09/28/2019 22:42      Allergies as of 09/30/2019      Reactions   Hydrocodone Other (See Comments)   confusion   Penicillins Rash   Did it involve swelling of the face/tongue/throat, SOB, or low BP? No Did it involve sudden or severe rash/hives, skin peeling, or any reaction on the inside of your mouth or nose? No Did you need to seek medical attention at a hospital or doctor's office? No When did it last happen? 1948 If all above answers are "NO", may proceed with cephalosporin use.      Medication List    TAKE these medications   acetaminophen 500 MG tablet Commonly known as: TYLENOL Take 500 mg by mouth every 6 (six) hours as needed (for pain.).   atorvastatin 80 MG tablet Commonly known as: LIPITOR Take 80 mg by mouth daily at 6 PM.   hyoscyamine 0.125 MG SL tablet Commonly known as: LEVSIN SL Place 0.125-0.25 mg under the tongue every 4 (four) hours as needed (abdominal cramping.).   losartan 50 MG tablet Commonly known as: COZAAR Take 50 mg by mouth every evening.   metoprolol succinate 25 MG 24 hr tablet Commonly known as: TOPROL-XL Take 12.5 mg by mouth daily.   mupirocin ointment 2 % Commonly known as: BACTROBAN Apply 1 application topically 2 (two) times daily as needed (skin abrasions.).   nystatin powder Commonly known as: MYCOSTATIN/NYSTOP Apply topically 4 (four) times daily. What changed:   how much to take  when to take this  reasons to take this   potassium chloride 10 MEQ tablet Commonly known as: KLOR-CON Take 10 mEq by mouth daily.   sulfamethoxazole-trimethoprim 800-160 MG  tablet Commonly known as: BACTRIM DS Take 1 tablet by mouth every 12 (twelve) hours.   torsemide 20 MG tablet Commonly known as: DEMADEX Take 20 mg by mouth daily.   triamcinolone ointment 0.5 % Commonly known as: KENALOG Apply 1 application topically 2 (two) times daily. What changed:   when to take this  reasons to take this   trimethoprim 100 MG tablet Commonly known as: TRIMPEX Take 1 tablet (  100 mg total) by mouth daily. Please begin the suppressive dose after you have completed the entire treatment course of bactrim   Uribel 118 MG Caps Take 1 capsule (118 mg total) by mouth daily as needed (up to 4 times a day). What changed:   when to take this  reasons to take this   Vitamin D3 50 MCG (2000 UT) Tabs Take 2,000 Units by mouth every evening.          Management plans discussed with the patient and wife and they are in agreement. Stable for discharge with hospice  Patient should follow up with urology  CODE STATUS:     Code Status Orders  (From admission, onward)         Start     Ordered   09/28/19 2349  Do not attempt resuscitation (DNR)  Continuous    Question Answer Comment  In the event of cardiac or respiratory ARREST Do not call a "code blue"   In the event of cardiac or respiratory ARREST Do not perform Intubation, CPR, defibrillation or ACLS   In the event of cardiac or respiratory ARREST Use medication by any route, position, wound care, and other measures to relive pain and suffering. May use oxygen, suction and manual treatment of airway obstruction as needed for comfort.      09/28/19 2354        Code Status History    This patient has a current code status but no historical code status.   Advance Care Planning Activity    Advance Directive Documentation     Most Recent Value  Type of Advance Directive  Healthcare Power of Attorney, Living will, Out of facility DNR (pink MOST or yellow form)  Pre-existing out of facility DNR order  (yellow form or pink MOST form)  Yellow form placed in chart (order not valid for inpatient use)  "MOST" Form in Place?  -      TOTAL TIME TAKING CARE OF THIS PATIENT: 39 minutes.    Note: This dictation was prepared with Dragon dictation along with smaller phrase technology. Any transcriptional errors that result from this process are unintentional.  Bettey Costa M.D on 09/30/2019 at 10:03 AM  Between 7am to 6pm - Pager - [phone] After 6pm go to [email or web address] - password [email or web address] [facility]  Office  [phone]  CC: Primary care physician; Dion Body, MD

## 2019-09-30 NOTE — Progress Notes (Signed)
Bonnita Levan Mangiapane A and O x4. VSS. Pt tolerating diet well. No complaints of nausea or vomiting. IV removed intact, prescriptions given. Discharge instructions sent with EMS. Patient discharged via EMS.  Allergies as of 09/30/2019      Reactions   Hydrocodone Other (See Comments)   confusion   Penicillins Rash   Did it involve swelling of the face/tongue/throat, SOB, or low BP? No Did it involve sudden or severe rash/hives, skin peeling, or any reaction on the inside of your mouth or nose? No Did you need to seek medical attention at a hospital or doctor's office? No When did it last happen? 1948 If all above answers are "NO", may proceed with cephalosporin use.      Medication List    TAKE these medications   acetaminophen 500 MG tablet Commonly known as: TYLENOL Take 500 mg by mouth every 6 (six) hours as needed (for pain.).   atorvastatin 80 MG tablet Commonly known as: LIPITOR Take 80 mg by mouth daily at 6 PM.   hyoscyamine 0.125 MG SL tablet Commonly known as: LEVSIN SL Place 0.125-0.25 mg under the tongue every 4 (four) hours as needed (abdominal cramping.).   losartan 50 MG tablet Commonly known as: COZAAR Take 50 mg by mouth every evening.   metoprolol succinate 25 MG 24 hr tablet Commonly known as: TOPROL-XL Take 12.5 mg by mouth daily.   mupirocin ointment 2 % Commonly known as: BACTROBAN Apply 1 application topically 2 (two) times daily as needed (skin abrasions.).   nystatin powder Commonly known as: MYCOSTATIN/NYSTOP Apply topically 4 (four) times daily. What changed:   how much to take  when to take this  reasons to take this   potassium chloride 10 MEQ tablet Commonly known as: KLOR-CON Take 10 mEq by mouth daily.   sulfamethoxazole-trimethoprim 800-160 MG tablet Commonly known as: BACTRIM DS Take 1 tablet by mouth every 12 (twelve) hours.   torsemide 20 MG tablet Commonly known as: DEMADEX Take 20 mg by mouth daily.   triamcinolone  ointment 0.5 % Commonly known as: KENALOG Apply 1 application topically 2 (two) times daily. What changed:   when to take this  reasons to take this   trimethoprim 100 MG tablet Commonly known as: TRIMPEX Take 1 tablet (100 mg total) by mouth daily. Please begin the suppressive dose after you have completed the entire treatment course of bactrim   Uribel 118 MG Caps Take 1 capsule (118 mg total) by mouth daily as needed (up to 4 times a day). What changed:   when to take this  reasons to take this   Vitamin D3 50 MCG (2000 UT) Tabs Take 2,000 Units by mouth every evening.       Vitals:   09/30/19 1200 09/30/19 1853  BP: 114/69 (!) 160/72  Pulse: 89 83  Resp: 14 19  Temp: 98.6 F (37 C)   SpO2: 99% 99%    Darnelle Catalan

## 2019-09-30 NOTE — Progress Notes (Signed)
Physical Therapy Treatment Patient Details Name: Alexander Hogan MRN: [id number] DOB: 01/16/1922 Today's Date: 09/30/2019    History of Present Illness Alexander Hogan is a 80yoM who comes to [facility] shortly after return to home following a urological procedure, as he was having weakness. Pt admitted with sepsis related to UTI. PTA pt AMB household distances with 4WW,    PT Comments    Pt in chair finished banana pudding, ready to begin with therapy. Jock strap donning coordinated with RN, now in place per urology recommendation. Author assisted pt with donnins of grey sweat pants, pt anxious about BLE pain and tenderness. RLE notably edematous as per baseline. MaxA for STS transfer, pt able to balance with BLE while author assists with pants waistband. Pt performs slow labored AMB to counter, but then fatigues unable to go further. Pt left up in recliner, all needs met.    Follow Up Recommendations  SNF;Supervision for mobility/OOB;Supervision/Assistance - 24 hour     Equipment Recommendations  Other (comment)(DME needs being managed by palliative care; would need hospital bed, WC)    Recommendations for Other Services       Precautions / Restrictions Precautions Precautions: Fall Precaution Comments: Has altered mentation at evaluation; needs jock strp for OOB per urology Required Braces or Orthoses: Other Brace Other Brace: needs jock strp for OOB per urology Restrictions Weight Bearing Restrictions: No    Mobility  Bed Mobility               General bed mobility comments: pt already up in chair upon arrival  Transfers Overall transfer level: Needs assistance Equipment used: Rolling walker (2 wheeled) Transfers: Sit to/from Stand Sit to Stand: Max assist         General transfer comment: heavy lift assist from chair, but able to stabilize with RW and balance while author assists with donning of sweatpants  Ambulation/Gait Ambulation/Gait assistance: Min  guard Gait Distance (Feet): 18 Feet(chair follow in room) Assistive device: Rolling walker (2 wheeled) Gait Pattern/deviations: Step-to pattern     General Gait Details: slow and labored, moving slow. Pt limited by fatigue   Stairs             Wheelchair Mobility    Modified Rankin (Stroke Patients Only)       Balance Overall balance assessment: Needs assistance Sitting-balance support: Single extremity supported;Feet unsupported;Feet supported Sitting balance-Leahy Scale: Good Sitting balance - Comments: difficulty with position in chair   Standing balance support: Bilateral upper extremity supported;During functional activity Standing balance-Leahy Scale: Poor                              Cognition Arousal/Alertness: Awake/alert Behavior During Therapy: WFL for tasks assessed/performed;Anxious Overall Cognitive Status: Within Functional Limits for tasks assessed                                        Exercises      General Comments        Pertinent Vitals/Pain Pain Assessment: Faces Faces Pain Scale: Hurts little more Pain Location: intermittent leg pain with donning pants Pain Intervention(s): Limited activity within patient's tolerance;Monitored during session;Repositioned;RN gave pain meds during session    Home Living                      Prior Function  PT Goals (current goals can now be found in the care plan section) Acute Rehab PT Goals Patient Stated Goal: regain independent mobility in home. PT Goal Formulation: With family Time For Goal Achievement: 10/13/19 Potential to Achieve Goals: Fair Progress towards PT goals: Progressing toward goals    Frequency    Min 2X/week      PT Plan Current plan remains appropriate    Co-evaluation              AM-PAC PT "6 Clicks" Mobility   Outcome Measure  Help needed turning from your back to your side while in a flat bed without  using bedrails?: A Lot Help needed moving from lying on your back to sitting on the side of a flat bed without using bedrails?: A Lot Help needed moving to and from a bed to a chair (including a wheelchair)?: A Lot Help needed standing up from a chair using your arms (e.g., wheelchair or bedside chair)?: A Lot Help needed to walk in hospital room?: A Little Help needed climbing 3-5 steps with a railing? : A Lot 6 Click Score: 13    End of Session   Activity Tolerance: Patient tolerated treatment well;No increased pain;Patient limited by fatigue;Patient limited by pain Patient left: in chair;with chair alarm set;with call bell/phone within reach Nurse Communication: Mobility status PT Visit Diagnosis: Muscle weakness (generalized) (M62.81);Difficulty in walking, not elsewhere classified (R26.2);Unsteadiness on feet (R26.81);Other abnormalities of gait and mobility (R26.89);Other symptoms and signs involving the nervous system (R29.898)     Time: 3818-4037 PT Time Calculation (min) (ACUTE ONLY): 16 min  Charges:  $Therapeutic Exercise: 8-22 mins                     2:00 PM, 09/30/19 Etta Grandchild, PT, DPT Physical Therapist - [facility]  [phone] ([place])    Kensli Bowley C 09/30/2019, 1:57 PM

## 2019-09-30 NOTE — Progress Notes (Signed)
Urology Inpatient Progress Note  **Do not manipulate this patient's Foley catheter.  If you need assistance with this catheter, please contact urology.**  Subjective: Alexander Hogan is a 85 y.o. 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  Antibiotics as below.   Patient remains at baseline today.  He has no acute concerns.  He is afebrile and hypertensive to the 140s/70s.  Heart rate normal.  Creatinine continues to downtrend today, 1.64 this morning.  Anti-infectives: Anti-infectives (From admission, onward)   Start     Dose/Rate Route Frequency Ordered Stop   09/29/19 1030  cefTRIAXone (ROCEPHIN) 1 g in sodium chloride 0.9 % 100 mL IVPB     1 g 200 mL/hr over 30 Minutes Intravenous Every 24 hours 09/29/19 1016     09/29/19 0630  aztreonam (AZACTAM) 2 g in sodium chloride 0.9 % 100 mL IVPB     2 g 200 mL/hr over 30 Minutes Intravenous  Once 09/28/19 2354 09/29/19 0800   09/28/19 2215  ceFEPIme (MAXIPIME) 1 g in sodium chloride 0.9 % 100 mL IVPB     1 g 200 mL/hr over 30 Minutes Intravenous  Once 09/28/19 2205 09/28/19 2300      Current Facility-Administered Medications  Medication Dose Route Frequency Provider Last Rate Last Dose  . acetaminophen (TYLENOL) tablet 500 mg  500 mg Oral Q6H PRN Lang Snow, NP      . atorvastatin (LIPITOR) tablet 80 mg  80 mg Oral q1800 Lang Snow, NP   80 mg at 09/29/19 2001  . cefTRIAXone (ROCEPHIN) 1 g in sodium chloride 0.9 % 100 mL IVPB  1 g Intravenous Q24H Mody, Sital, MD 200 mL/hr at 09/29/19 1325 1 g at 09/29/19 1325  . Chlorhexidine Gluconate Cloth 2 % PADS 6 each  6 each Topical Daily Ouma, Bing Neighbors, NP      . cholecalciferol  (VITAMIN D3) tablet 2,000 Units  2,000 Units Oral QPM Lang Snow, NP   2,000 Units at 09/29/19 2001  . heparin injection 5,000 Units  5,000 Units Subcutaneous Q8H Lang Snow, NP   5,000 Units at 09/30/19 0541  . hyoscyamine (LEVSIN SL) SL tablet 0.125-0.25 mg  0.125-0.25 mg Sublingual Q4H PRN Lang Snow, NP      . metoprolol succinate (TOPROL-XL) 24 hr tablet 12.5 mg  12.5 mg Oral Daily Lang Snow, NP   12.5 mg at 09/29/19 1016  . mupirocin ointment (BACTROBAN) 2 % 1 application  1 application Topical BID PRN Lang Snow, NP      . nystatin (MYCOSTATIN/NYSTOP) topical powder 1 g  1 g Topical QID PRN Lang Snow, NP      . triamcinolone ointment (KENALOG) 0.5 % 1 application  1 application Topical BID PRN Lang Snow, NP      . Urelle (URELLE/URISED) 81 MG tablet 81 mg  1 tablet Oral QID PRN Lang Snow, NP         Objective: Vital signs in last 24 hours: Temp:  [98.2 F (36.8 C)-98.6 F (37 C)] 98.6 F (37 C) (10/28 0524) Pulse Rate:  [72-84] 81 (10/28 0524) Resp:  [16-18] 16 (10/28 0524) BP: (129-149)/(56-78) 144/78 (10/28 0524) SpO2:  [98 %-100 %] 98 % (10/28 0524)  Intake/Output from previous  day: 10/27 0701 - 10/28 0700 In: 120 [P.O.:120] Out: 850 [Urine:850] Intake/Output this shift: No intake/output data recorded.  Physical Exam Vitals signs and nursing note reviewed.  Constitutional:      General: He is not in acute distress.    Appearance: He is not ill-appearing, toxic-appearing or diaphoretic.  HENT:     Head: Normocephalic and atraumatic.  Abdominal:     General: There is no distension.     Palpations: Abdomen is soft. There is no mass.     Tenderness: There is no abdominal tenderness. There is no guarding or rebound.  Genitourinary:    Comments: Foley catheter in place draining clear yellow urine.  Posterior scrotal incision with iodoform packing in place. Skin:     General: Skin is warm and dry.     Findings: Lesion (Scattered melanoma lesions of the BLEs, R>L) present.  Neurological:     Mental Status: He is alert. Mental status is at baseline.  Psychiatric:        Mood and Affect: Mood normal.    Lab Results:  Recent Labs    09/28/19 2208 09/29/19 0423  WBC 10.7* 12.8*  HGB 12.0* 10.4*  HCT 37.8* 32.9*  PLT 130* 123*   BMET Recent Labs    09/29/19 0423 09/30/19 0407  NA 138 138  K 4.6 4.3  CL 108 109  CO2 23 23  GLUCOSE 135* 108*  BUN 26* 22  CREATININE 1.96* 1.64*  CALCIUM 8.3* 8.2*   PT/INR Recent Labs    09/28/19 2208 09/29/19 0423  LABPROT 14.0 13.9  INR 1.1 1.1   Assessment & Plan: Patient continues at baseline today.  No new recommendations.  I believe he is ready for discharge.  This morning I removed approximately 2 inches of iodoform packing from his scrotal incision.  On discharge, plan to continue this daily until packing is completely removed.  **Do not manipulate this patient's Foley catheter.  If you need assistance with this catheter, please contact urology.Debroah Loop, PA-C 09/30/2019

## 2019-10-02 NOTE — Telephone Encounter (Signed)
Spoke with wife informed of instructions-provided fax number for facility-faxed orders. Verbalized understanding.

## 2019-10-03 LAB — CULTURE, BLOOD (ROUTINE X 2)
Culture  Setup Time: NONE SEEN
Culture: NO GROWTH
Culture: NO GROWTH
Special Requests: ADEQUATE

## 2019-10-12 ENCOUNTER — Telehealth: Payer: Self-pay | Admitting: Urology

## 2019-10-12 ENCOUNTER — Ambulatory Visit: Payer: Medicare Other | Admitting: Urology

## 2019-10-12 NOTE — Telephone Encounter (Signed)
Jan from Hospice called asking for a return call, she has some questions about his wound care. She did call last week and left the message on my voicemail while I was out of the office. She can be reached at [place]

## 2019-10-12 NOTE — Telephone Encounter (Signed)
Spoke with Jan-hospice nurse- regarding packing-all of the packing has been removed. Wanted to know if there was anything else he needs to have covered over the wound or leave as is? She also had a question regarding prescriptions he was taking-verified daily medications.

## 2019-10-12 NOTE — Telephone Encounter (Signed)
Informed Jan-verbalized understanding.

## 2019-10-12 NOTE — Telephone Encounter (Signed)
I would recommend gauze only in his undergarments to protect from any residual drainage.  If there are any questions about the wounds, he can come in for a wound check.  Hollice Espy, MD

## 2019-10-13 ENCOUNTER — Ambulatory Visit: Payer: Medicare Other | Admitting: Urology

## 2019-10-14 ENCOUNTER — Ambulatory Visit: Payer: Medicare Other | Admitting: Urology

## 2019-10-28 ENCOUNTER — Other Ambulatory Visit: Payer: Self-pay

## 2019-10-28 ENCOUNTER — Ambulatory Visit (INDEPENDENT_AMBULATORY_CARE_PROVIDER_SITE_OTHER): Payer: Medicare Other | Admitting: Urology

## 2019-10-28 VITALS — BP 148/83 | HR 88

## 2019-10-28 DIAGNOSIS — T83718A Erosion of other implanted mesh and other prosthetic materials to surrounding organ or tissue, initial encounter: Secondary | ICD-10-CM | POA: Diagnosis not present

## 2019-10-28 MED ORDER — SULFAMETHOXAZOLE-TRIMETHOPRIM 800-160 MG PO TABS
1.0000 | ORAL_TABLET | Freq: Once | ORAL | Status: AC
  Administered 2019-10-28: 1 via ORAL

## 2019-10-28 NOTE — Progress Notes (Signed)
   10/28/19  CC:  Chief Complaint  Patient presents with  . 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  He had a large scrotal defect which has healed nicely with the packing removed and only a small amount of residual granulation tissue.  Other incisions have also healed well.  Notably, the cough had eroded circumferentially with a large bulbourethral defect with a very difficult intraoperative Foley catheter placement.  He returns today for Foley catheter placement over wire.  He is brought in by a caretaker today.  Unfortunately, his wife fell and broke his hip.  His daughter was available by phone today.  She reports that he is overall doing reasonably well given the circumstances.   Procedure:  Patient's identity was confirmed.  He received oral Bactrim per procedurally.  Blood pressure (!) 148/83, pulse 88. NED. A&Ox3.   No respiratory distress   Abd soft, NT, ND, right lower abdominal incision well-healed. Scrotal incision also appears to be healing well with a small less than 1 cm area of granulation tissue without fluctuance. Perineal incision also well-healed.  Patient was prepped and draped in standard sterile fashion.  His Foley catheter was cannulate using a Super Stiff wire into the level of the bladder.  This was carefully removed with care to try to keep the wire sterile as possible.  He was reprepped and draped again.  At this point in time, a new 3 [place] [employer] tip Foley catheter was advanced over the wire.  Notably, upon reaching the bulbar urethra there was a good amount of resistance indicative of either stricture formation in the area or residual defect.  Was able to advance the catheter all the way to the helped position at which time the wire was withdrawn.  The wound was ablated with 10 cc of sterile water.  At this time, the bladder.  Clear yellow urine.  The catheter was secured.  The  procedure was well-tolerated.   Assessment and plan: Given my concern for an ongoing fairly significant urethral defect versus stricture, I recommended keeping the Foley catheter for at least another 1 to 2 months.  This will require exchanges over wire.  At the 47-month mark, may consider office cystoscopy to evaluate the area to assess whether or not it is healed enough to forego further exchanges.  This plan was discussed with the patient's daughter who is agreeable this plan.  Follow-up in 1 month for next catheter exchange over wire  Hollice Espy, MD

## 2019-11-25 ENCOUNTER — Telehealth: Payer: Self-pay | Admitting: Urology

## 2019-11-25 NOTE — Telephone Encounter (Signed)
ERROR

## 2019-12-02 NOTE — Telephone Encounter (Signed)
-----   Message from Billey Co, MD sent at 12/01/2019 10:32 AM EST ----- Regarding: RE: follow up appt He is a complex patient of Dr. Audree Bane- the foley does need to be changed at some point in the next few weeks, but if he wants to wait 1 or 2 weeks after his wife's passing I think that's reasonable.   Would defer to Dr. Erlene Quan since this is her patient, but I would say re-schedule with her in 2 weeks for foley change in clinic. I am also happy to change it this week as scheduled if she feels strongly that he should come in.  Thanks Nickolas Madrid, MD 12/01/2019  ----- Message ----- From: Benard Halsted Sent: 12/01/2019   9:49 AM EST To: Billey Co, MD, Hollice Espy, MD Subject: follow up appt                                 I just received a call from this patient's son Jenny Reichmann. He informed me that Mr. Slomski wife passed away on 12/28/19 and that Mr. Joung has been placed in the care of hospice. He wants to know if he really needs to have this procedure done? He said he is not doing well and is under a lot of stress. He doesn't feel that he should come in. Please advise on what you think is ok for him to do.   Thanks, Sharyn Lull    The patient's son can be reached at [phone]

## 2019-12-02 NOTE — Telephone Encounter (Signed)
Patient's son called back and rescheduled for next week

## 2019-12-03 ENCOUNTER — Other Ambulatory Visit: Payer: Medicare Other | Admitting: Urology

## 2019-12-07 ENCOUNTER — Telehealth: Payer: Self-pay | Admitting: Urology

## 2019-12-07 ENCOUNTER — Other Ambulatory Visit: Payer: Medicare Other | Admitting: Urology

## 2019-12-07 NOTE — Telephone Encounter (Signed)
Spoke with Jenny Reichmann patient's son and explained the situation per Dr. Cherrie Gauze note. He states he will speak with his other siblings and let us know if they wish to keep the apt tomorrow

## 2019-12-07 NOTE — Telephone Encounter (Signed)
As per previous discussions, I am concerned that the patient still has a defect in his urethra.  Without doing of cystoscopy or other contrast study, there is no way to know if this is healed all the way.  If there are still hole, he is at risk for infection or abscess or development of scar tissue down the road.  I would ideally like him to come in tomorrow so we can scope the area and see if it is healed and if he is emptying his bladder completely.  I feel comfortable leaving the catheter out on the situation.  If the patient's prognosis is very poor, we can just leave the catheter out and see what happens and deal with it if he has a complication.  The longer the catheter stays out, the less likely that it going to be able able to be replaced in the office.  Hollice Espy, MD

## 2019-12-07 NOTE — Telephone Encounter (Signed)
Patient's son would like to know if he can keep foley out or if he must have it replaced?

## 2019-12-07 NOTE — Telephone Encounter (Signed)
Patient's son called back and will keep appt tomorrow

## 2019-12-07 NOTE — Telephone Encounter (Signed)
Hospice nurse called and wants to let you know that the pt has pulled his cath out. She also wants to let you know that his wife passed away unexpected.

## 2019-12-08 ENCOUNTER — Ambulatory Visit (INDEPENDENT_AMBULATORY_CARE_PROVIDER_SITE_OTHER): Payer: Medicare Other | Admitting: Urology

## 2019-12-08 ENCOUNTER — Other Ambulatory Visit: Payer: Self-pay

## 2019-12-08 ENCOUNTER — Encounter: Payer: Self-pay | Admitting: Urology

## 2019-12-08 VITALS — BP 118/68

## 2019-12-08 DIAGNOSIS — T83111S Breakdown (mechanical) of urinary sphincter implant, sequela: Secondary | ICD-10-CM | POA: Diagnosis not present

## 2019-12-08 MED ORDER — CIPROFLOXACIN HCL 500 MG PO TABS
500.0000 mg | ORAL_TABLET | Freq: Once | ORAL | Status: AC
  Administered 2019-12-08: 500 mg via ORAL

## 2019-12-10 ENCOUNTER — Other Ambulatory Visit: Payer: Medicare Other | Admitting: Urology

## 2019-12-10 NOTE — Progress Notes (Signed)
   12/07/18  CC:  Chief Complaint  Patient presents with  . Cysto    HPI: 86 year old male with eroded AUS status post removal managed with an indwelling Foley with exchanged over wire who presents today for cystoscopic evaluation.  Notably, he has had a fairly precipitous decline in his overall health status.  His wife died suddenly on 11-29-23 and he is now admitted to inpatient hospice.  Yesterday, he pulled out his own catheter with the balloon up.  His son believes is because he is wanting to pass away.  Blood pressure 118/68. NED. A&Ox3.   No respiratory distress   Abd soft, NT, ND Normal phallus with bilateral descended testicles  Bladder scan prior to the procedure indicated an empty bladder.  PVR 0.  Cystoscopy Procedure Note  Patient identification was confirmed, informed consent was obtained, and patient was prepped using Betadine solution.  Lidocaine jelly was administered per urethral meatus.     Pre-Procedure: - Inspection reveals a normal caliber ureteral meatus.  Procedure: The flexible cystoscope was introduced without difficulty -The area of defect within the bulbar urethra was encountered.  Primarily, this area had remained calx lysed with a small area of edema around the 5 o'clock position but no obvious gross defect.  The area was able to be navigated with the 93 French cystoscope relatively easily with surgically absent prostate into the bladder.  The scope was then subsequently removed and the patient leaked urine.  There is no obvious extravasation of fluid in the perineum or scrotum on cystoscopy.  Assessment/ Plan:  1. Cuff erosion of artificial urinary sphincter, sequela Emptying now without Foley catheter  Cystoscopic evaluation reveals that the bulbar urethra has healed sufficiently and thus no longer requires Foley catheter.  He is at risk for development of stricture in this area however given his overall health condition and decline, hopefully  this will not be an issue for him.    Discussed findings with his son.  If he does have issues difficulty and, bladder distention or any other complications, he may need an SP tube in the future. - ciprofloxacin (CIPRO) tablet 500 mg  F/u as needed  Hollice Espy, MD

## 2020-01-04 DEATH — deceased

## 2020-10-13 IMAGING — DX DG CHEST 1V PORT
1 series · 1 of 1 positions shown · non-contrast
Comparison: None.

CLINICAL DATA: Sepsis workup

EXAM:
PORTABLE CHEST 1 VIEW

[chest ap]
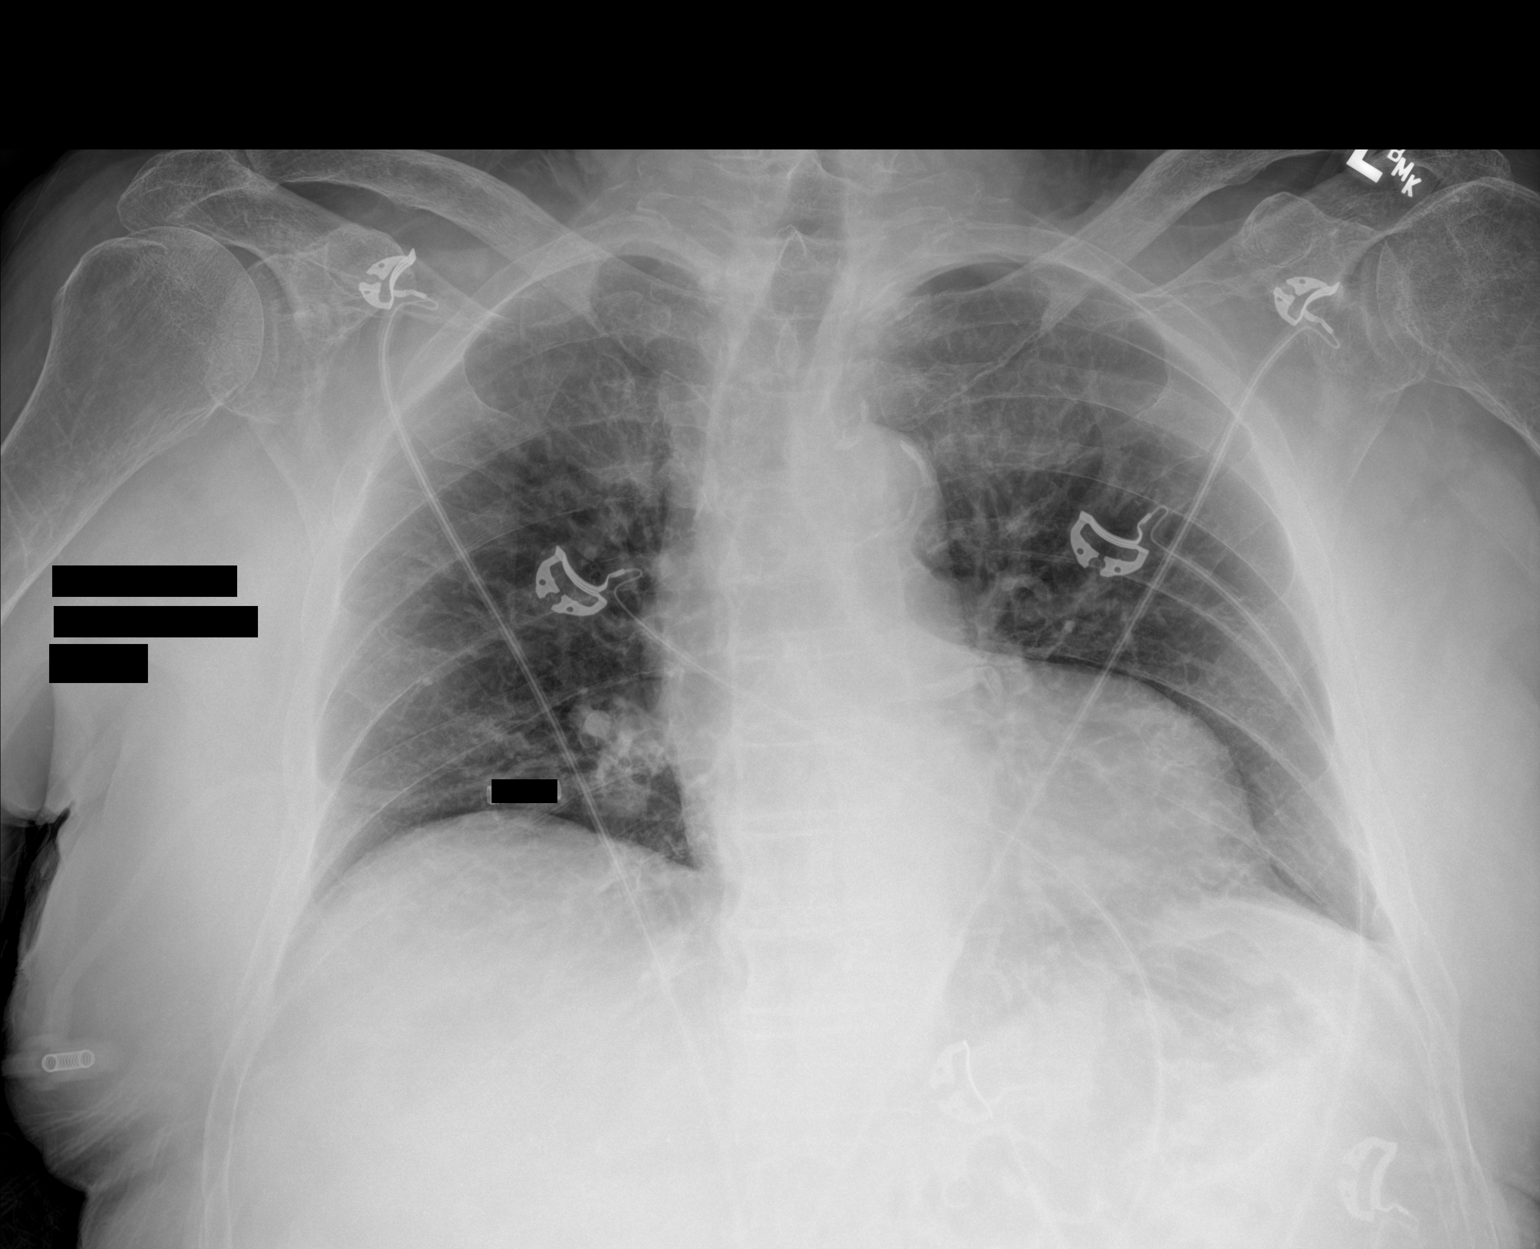

[1 of 1 positions shown; findings below may reference images not displayed]

FINDINGS: Cardiac shadows within normal limits. Aortic calcifications are
noted. The lungs are hypoinflated but clear. No bony abnormality is
seen.
IMPRESSION: No acute abnormality noted.
